# Patient Record
Sex: Female | Born: 1970 | Race: White | Hispanic: No | Smoking: Current every day smoker
Health system: Southern US, Community
[De-identification: ages and names within clinical notes are randomized; demographics above are authoritative.]

## PROBLEM LIST (undated history)

## (undated) DIAGNOSIS — M1990 Unspecified osteoarthritis, unspecified site: Secondary | ICD-10-CM

## (undated) DIAGNOSIS — F32A Depression, unspecified: Secondary | ICD-10-CM

## (undated) DIAGNOSIS — F419 Anxiety disorder, unspecified: Secondary | ICD-10-CM

## (undated) DIAGNOSIS — F329 Major depressive disorder, single episode, unspecified: Secondary | ICD-10-CM

## (undated) HISTORY — PX: TUBAL LIGATION: SHX77

---

## 2009-11-21 ENCOUNTER — Emergency Department (HOSPITAL_COMMUNITY): Admission: EM | Admit: 2009-11-21 | Discharge: 2009-11-21 | Payer: Self-pay | Admitting: Emergency Medicine

## 2010-03-22 ENCOUNTER — Emergency Department (HOSPITAL_COMMUNITY): Admission: EM | Admit: 2010-03-22 | Discharge: 2010-03-22 | Payer: Self-pay | Admitting: Emergency Medicine

## 2011-07-16 ENCOUNTER — Encounter (HOSPITAL_COMMUNITY): Payer: Self-pay

## 2011-07-16 ENCOUNTER — Emergency Department (HOSPITAL_COMMUNITY)
Admission: EM | Admit: 2011-07-16 | Discharge: 2011-07-16 | Disposition: A | Discharge status: Home / Self Care | Payer: No Typology Code available for payment source | Attending: Emergency Medicine | Admitting: Emergency Medicine

## 2011-07-16 ENCOUNTER — Emergency Department (HOSPITAL_COMMUNITY): Payer: No Typology Code available for payment source

## 2011-07-16 DIAGNOSIS — R079 Chest pain, unspecified: Secondary | ICD-10-CM | POA: Insufficient documentation

## 2011-07-16 DIAGNOSIS — M25519 Pain in unspecified shoulder: Secondary | ICD-10-CM | POA: Insufficient documentation

## 2011-07-16 DIAGNOSIS — M539 Dorsopathy, unspecified: Secondary | ICD-10-CM | POA: Insufficient documentation

## 2011-07-16 DIAGNOSIS — Z79899 Other long term (current) drug therapy: Secondary | ICD-10-CM | POA: Insufficient documentation

## 2011-07-16 DIAGNOSIS — M542 Cervicalgia: Secondary | ICD-10-CM | POA: Insufficient documentation

## 2011-07-16 DIAGNOSIS — S139XXA Sprain of joints and ligaments of unspecified parts of neck, initial encounter: Secondary | ICD-10-CM | POA: Insufficient documentation

## 2011-07-16 DIAGNOSIS — S161XXA Strain of muscle, fascia and tendon at neck level, initial encounter: Secondary | ICD-10-CM

## 2011-07-16 DIAGNOSIS — S40019A Contusion of unspecified shoulder, initial encounter: Secondary | ICD-10-CM | POA: Insufficient documentation

## 2011-07-16 DIAGNOSIS — S20219A Contusion of unspecified front wall of thorax, initial encounter: Secondary | ICD-10-CM | POA: Insufficient documentation

## 2011-07-16 MED ORDER — IBUPROFEN 600 MG PO TABS: 600.0000 mg | ORAL_TABLET | Freq: Four times a day (QID) | ORAL | Status: AC | PRN

## 2011-07-16 MED ORDER — OXYCODONE-ACETAMINOPHEN 5-325 MG PO TABS: 1.0000 | ORAL_TABLET | ORAL | Status: AC | PRN

## 2011-07-16 MED ORDER — OXYCODONE-ACETAMINOPHEN 5-325 MG PO TABS
1.0000 | ORAL_TABLET | Freq: Once | ORAL | Status: AC
  Administered 2011-07-16: 1 via ORAL
  Filled 2011-07-16: qty 1
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
## 2011-07-16 NOTE — ED Notes (Signed)
Pt c/o left shoulder pain and left side of neck pain.   Pt has bruising noted to chest.
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
## 2011-07-17 NOTE — ED Provider Notes (Signed)
Medical screening examination/treatment/procedure(s) were performed by non-physician practitioner and as supervising physician I was immediately available for consultation/collaboration.   Neya Creegan L Rodnesha Elie, MD 07/17/11 1920 

## 2011-12-18 ENCOUNTER — Other Ambulatory Visit: Payer: Self-pay

## 2011-12-18 ENCOUNTER — Encounter (HOSPITAL_COMMUNITY): Payer: Self-pay | Admitting: Emergency Medicine

## 2011-12-18 ENCOUNTER — Emergency Department (HOSPITAL_COMMUNITY): Payer: Self-pay

## 2011-12-18 ENCOUNTER — Emergency Department (HOSPITAL_COMMUNITY)
Admission: EM | Admit: 2011-12-18 | Discharge: 2011-12-18 | Disposition: A | Discharge status: Home / Self Care | Payer: Self-pay | Attending: Emergency Medicine | Admitting: Emergency Medicine

## 2011-12-18 DIAGNOSIS — R1110 Vomiting, unspecified: Secondary | ICD-10-CM | POA: Insufficient documentation

## 2011-12-18 DIAGNOSIS — M25519 Pain in unspecified shoulder: Secondary | ICD-10-CM | POA: Insufficient documentation

## 2011-12-18 DIAGNOSIS — R1013 Epigastric pain: Secondary | ICD-10-CM | POA: Insufficient documentation

## 2011-12-18 DIAGNOSIS — R10816 Epigastric abdominal tenderness: Secondary | ICD-10-CM | POA: Insufficient documentation

## 2011-12-18 HISTORY — DX: Anxiety disorder, unspecified: F41.9

## 2011-12-18 HISTORY — DX: Depression, unspecified: F32.A

## 2011-12-18 HISTORY — DX: Unspecified osteoarthritis, unspecified site: M19.90

## 2011-12-18 HISTORY — DX: Major depressive disorder, single episode, unspecified: F32.9

## 2011-12-18 LAB — CBC WITH DIFFERENTIAL/PLATELET
Basophils Relative: 0 % (ref 0–1)
Hemoglobin: 12.5 g/dL (ref 12.0–15.0)
MCHC: 33.9 g/dL (ref 30.0–36.0)
Monocytes Relative: 8 % (ref 3–12)
Neutro Abs: 3.3 10*3/uL (ref 1.7–7.7)
Neutrophils Relative %: 63 % (ref 43–77)
RBC: 3.98 MIL/uL (ref 3.87–5.11)
WBC: 5.3 10*3/uL (ref 4.0–10.5)

## 2011-12-18 LAB — COMPREHENSIVE METABOLIC PANEL
AST: 13 U/L (ref 0–37)
Albumin: 3.2 g/dL — ABNORMAL LOW (ref 3.5–5.2)
Alkaline Phosphatase: 50 U/L (ref 39–117)
BUN: 13 mg/dL (ref 6–23)
Chloride: 102 mEq/L (ref 96–112)
Potassium: 4 mEq/L (ref 3.5–5.1)
Total Bilirubin: 0.1 mg/dL — ABNORMAL LOW (ref 0.3–1.2)

## 2011-12-18 LAB — URINALYSIS, ROUTINE W REFLEX MICROSCOPIC
Bilirubin Urine: NEGATIVE
Glucose, UA: NEGATIVE mg/dL
Hgb urine dipstick: NEGATIVE
Ketones, ur: NEGATIVE mg/dL
pH: 7 (ref 5.0–8.0)

## 2011-12-18 LAB — LIPASE, BLOOD: Lipase: 57 U/L (ref 11–59)

## 2011-12-18 MED ORDER — GI COCKTAIL ~~LOC~~
30.0000 mL | Freq: Once | ORAL | Status: AC
  Administered 2011-12-18: 30 mL via ORAL
  Filled 2011-12-18: qty 30

## 2011-12-18 MED ORDER — ONDANSETRON 4 MG PO TBDP
4.0000 mg | ORAL_TABLET | Freq: Once | ORAL | Status: AC
  Administered 2011-12-18: 4 mg via ORAL
  Filled 2011-12-18: qty 1

## 2011-12-18 MED ORDER — OMEPRAZOLE 20 MG PO CPDR: 20.0000 mg | DELAYED_RELEASE_CAPSULE | Freq: Every day | ORAL | Status: DC

## 2011-12-18 MED ORDER — PANTOPRAZOLE SODIUM 40 MG PO TBEC
40.0000 mg | DELAYED_RELEASE_TABLET | Freq: Once | ORAL | Status: AC
  Administered 2011-12-18: 40 mg via ORAL
  Filled 2011-12-18: qty 1

## 2011-12-18 NOTE — ED Notes (Signed)
Pt c/o epigastric pain, abd swelling and vomiting since yesterday.
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
## 2012-01-23 ENCOUNTER — Ambulatory Visit (INDEPENDENT_AMBULATORY_CARE_PROVIDER_SITE_OTHER): Payer: Self-pay | Admitting: Gastroenterology

## 2012-01-23 ENCOUNTER — Encounter: Payer: Self-pay | Admitting: Gastroenterology

## 2012-01-23 VITALS — BP 102/71 | HR 79 | Temp 98.6°F | Ht 62.0 in | Wt 109.6 lb

## 2012-01-23 DIAGNOSIS — R1013 Epigastric pain: Secondary | ICD-10-CM

## 2012-01-23 DIAGNOSIS — Z139 Encounter for screening, unspecified: Secondary | ICD-10-CM

## 2012-01-23 MED ORDER — PANTOPRAZOLE SODIUM 40 MG PO TBEC: 40.0000 mg | DELAYED_RELEASE_TABLET | Freq: Every day | ORAL | Status: DC

## 2012-01-23 NOTE — Patient Instructions (Addendum)
Avoid medications such as ibuprofen, advil, aleve, motrin.  Start taking Protonix once daily, 30 minutes before the first meal of the day. This prescription has been sent to your pharmacy.   Seek medical attention right away if you notice any black, tarry stools, worsening belly pain, bloody vomit.   You have been set up for an upper endoscopy in the near future with Dr. Jena Gauss.  Further recommendations to follow once completed.

## 2012-01-23 NOTE — Progress Notes (Signed)
Referring Provider: Jeani [name] ED Primary Care Physician:  No primary provider on file. Primary Gastroenterologist:  Dr. Jena [name]   Chief Complaint  Patient presents with  . Abdominal Pain    HPI:  41 year old female who presents today at request of ED physician after being seen mid July for acute abdominal pain.  Notes new onset upper abdominal pain, sharp, located epigastric region. Notes abdominal tightness, bloating, swollen. Symptoms times one month currently. Notes weight loss since starting Celexa about 6 months ago. Used to weigh 136, now 109. Notes some days of decreased appetite. +early satiety, but she states has always been a light eater. No N/V. No fever, chills. No melena, hematochezia. Takes Ibuprofen for arthritis as needed but not daily. Denies reflux symptoms. Took Prilosec X 30 days, given at ED, no improvement. Gallbladder remains in situ. Notes hx of stomach ulcers as a teenager. Dr. Dimas [name] from [place] diagnosed her with ulcers. No prior EGD. Takes Maalox to alleviate abdominal pain.   Past Medical History  Diagnosis Date  . Arthritis   . Anxiety   . Depression     Past Surgical History  Procedure Date  . Tubal ligation     Current Outpatient Prescriptions  Medication Sig Dispense Refill  . acetaminophen (TYLENOL) 500 MG tablet Take 1,000 mg by mouth every 6 (six) hours as needed. For pain      . citalopram (CELEXA) 20 MG tablet Take 20 mg by mouth daily.       . naproxen sodium (ALEVE) 220 MG tablet Take 440 mg by mouth 2 (two) times daily as needed. Pain      . ALPRAZolam (XANAX) 1 MG tablet Take 1 mg by mouth 4 (four) times daily.       Marland Kitchen oxyCODONE-acetaminophen (PERCOCET) 5-325 MG per tablet Take 1 tablet by mouth every 4 (four) hours as needed. Pain      . pantoprazole (PROTONIX) 40 MG tablet Take 1 tablet (40 mg total) by mouth daily. Take 30 minutes before first meal of day.  30 tablet  1    Allergies as of 01/23/2012  . (No Known Allergies)    Family  History  Problem Relation Age of Onset  . Stomach cancer Mother 81    deceased age 61.   . Colon cancer Neg Hx     History   Social History  . Marital Status: Divorced    Spouse Name: Loretta [name]    Number of Children: 2  . Years of Education: Loretta [name]   Occupational History  .      works at [employer]   Social History Main Topics  . Smoking status: Current Everyday Smoker -- 0.2 packs/day    Types: Cigarettes  . Smokeless tobacco: Not on file  . Alcohol Use: No  . Drug Use: Yes    Special: Marijuana     smokes to try to increase appetite. Last used Aug 16th   . Sexually Active: Not on file   Other Topics Concern  . Not on file   Social History Narrative   Son, girlfriend, 3 kids live with her.     Review of Systems: Gen: SEE HPI CV: Denies chest pain, heart palpitations, syncope, peripheral edema. Resp: Denies shortness of breath with rest, cough, wheezing GI: Denies dysphagia or odynophagia. Denies hematemesis, fecal incontinence, or jaundice.  GU : Denies urinary burning, urinary frequency, urinary incontinence.  MS: Denies joint pain, muscle weakness, cramps, limited movement Derm: Denies rash, itching, dry skin Psych: Denies  depression, anxiety, confusion or memory loss  Heme: Denies bruising, bleeding, and enlarged lymph nodes.  Physical Exam: BP 102/71  Pulse 79  Temp 98.6 F (37 C) (Temporal)  Ht 5\' 2"  (1.575 m)  Wt 109 lb 9.6 oz (49.714 kg)  BMI 20.05 kg/m2  LMP 12/25/2011 General:   Alert and oriented. Well-developed, well-nourished, pleasant and cooperative. Head:  Normocephalic and atraumatic. Eyes:  Conjunctiva pink, sclera clear, no icterus.   Ears:  Normal auditory acuity. Nose:  No deformity, discharge,  or lesions. Mouth:  No deformity or lesions, mucosa pink and moist.  Neck:  Supple, without mass or thyromegaly. Lungs:  Clear to auscultation bilaterally, without wheezing, rales, or rhonchi.  Heart:  S1, S2 present without murmurs noted.    Abdomen:  +BS, soft, mild TTP epigastric region and non-distended. Without mass or HSM. No rebound or guarding. No hernias noted. Rectal:  Deferred  Msk:  Symmetrical without gross deformities. Normal posture. Extremities:  Without clubbing or edema. Neurologic:  Alert and  oriented x4;  grossly normal neurologically. Skin:  Intact, warm and dry without significant lesions or rashes Cervical Nodes:  No significant cervical adenopathy. Psych:  Alert and cooperative. Normal mood and affect.

## 2012-01-23 NOTE — Assessment & Plan Note (Signed)
41 year old female with new-onset dyspepsia in the setting of intermittent NSAID use. Notes remote hx of "ulcers" as a child, but she has never had an upper endoscopy. Mother diagnosed with stomach cancer in mid-40s, deceased shortly thereafter. Weight loss of 27 lbs concerning, although pt attributes this to starting Celexa 6 months ago. I am not entirely convinced this medication is the culprit. Concern for gastritis, PUD, occult malignancy unable to be excluded. Biliary etiology remains in differential although less likely.   Resume PPI: Will use Protonix due to risk of interaction with Prilosec and Celexa Obtain urine drug screen in about 3 weeks: pt uses marijuana occasionally Proceed with upper endoscopy in the near future with Dr. Jena Gauss. The risks, benefits, and alternatives have been discussed in detail with patient. They have stated understanding and desire to proceed.  Phenergan 25 mg IV on call due to hx of marijuana use

## 2012-01-23 NOTE — Progress Notes (Signed)
No PCP on file 

## 2012-01-24 NOTE — Progress Notes (Signed)
No PCP on file 

## 2012-01-30 ENCOUNTER — Encounter (HOSPITAL_COMMUNITY): Payer: Self-pay | Admitting: Pharmacy Technician

## 2012-02-09 ENCOUNTER — Telehealth: Payer: Self-pay | Admitting: Internal Medicine

## 2012-02-09 NOTE — Telephone Encounter (Signed)
Pt called with questions about her medicines (percocet) and other medicines she's taking. She is worried about her drug test today and upcoming procedure. Please call her back at [phone]

## 2012-02-09 NOTE — Telephone Encounter (Signed)
Talked with pt, answered all her questions.

## 2012-02-10 LAB — DRUG SCREEN, URINE
Amphetamine Screen, Ur: NEGATIVE
Barbiturate Quant, Ur: NEGATIVE
Cocaine Metabolites: NEGATIVE
Marijuana Metabolite: POSITIVE — AB
Opiates: NEGATIVE

## 2012-02-12 ENCOUNTER — Encounter (HOSPITAL_COMMUNITY): Payer: Self-pay | Admitting: *Deleted

## 2012-02-12 ENCOUNTER — Encounter (HOSPITAL_COMMUNITY)
Admission: RE | Disposition: A | Discharge status: Home / Self Care | Payer: Self-pay | Source: Ambulatory Visit | Attending: Internal Medicine

## 2012-02-12 ENCOUNTER — Ambulatory Visit: Admit: 2012-02-12 | Payer: Self-pay | Admitting: Internal Medicine

## 2012-02-12 ENCOUNTER — Ambulatory Visit (HOSPITAL_COMMUNITY)
Admission: RE | Admit: 2012-02-12 | Discharge: 2012-02-12 | Disposition: A | Discharge status: Home / Self Care | Payer: Self-pay | Source: Ambulatory Visit | Attending: Internal Medicine | Admitting: Internal Medicine

## 2012-02-12 DIAGNOSIS — K2960 Other gastritis without bleeding: Secondary | ICD-10-CM

## 2012-02-12 DIAGNOSIS — R1013 Epigastric pain: Secondary | ICD-10-CM

## 2012-02-12 DIAGNOSIS — R634 Abnormal weight loss: Secondary | ICD-10-CM | POA: Insufficient documentation

## 2012-02-12 DIAGNOSIS — Z139 Encounter for screening, unspecified: Secondary | ICD-10-CM

## 2012-02-12 HISTORY — PX: ESOPHAGOGASTRODUODENOSCOPY: SHX5428

## 2012-02-12 SURGERY — EGD (ESOPHAGOGASTRODUODENOSCOPY)
Anesthesia: Moderate Sedation

## 2012-02-12 MED ORDER — PROMETHAZINE HCL 25 MG/ML IJ SOLN
25.0000 mg | Freq: Once | INTRAMUSCULAR | Status: AC
  Administered 2012-02-12: 25 mg via INTRAVENOUS

## 2012-02-12 MED ORDER — MEPERIDINE HCL 100 MG/ML IJ SOLN
INTRAMUSCULAR | Status: AC
  Filled 2012-02-12: qty 2

## 2012-02-12 MED ORDER — PROMETHAZINE HCL 25 MG/ML IJ SOLN
INTRAMUSCULAR | Status: AC
  Filled 2012-02-12: qty 1

## 2012-02-12 MED ORDER — STERILE WATER FOR IRRIGATION IR SOLN
Status: DC | PRN
  Administered 2012-02-12: 14:00:00

## 2012-02-12 MED ORDER — SODIUM CHLORIDE 0.9 % IJ SOLN
INTRAMUSCULAR | Status: AC
  Filled 2012-02-12: qty 10

## 2012-02-12 MED ORDER — MEPERIDINE HCL 100 MG/ML IJ SOLN
INTRAMUSCULAR | Status: DC | PRN
  Administered 2012-02-12 (×3): 50 mg via INTRAVENOUS

## 2012-02-12 MED ORDER — MIDAZOLAM HCL 5 MG/5ML IJ SOLN
INTRAMUSCULAR | Status: AC
  Filled 2012-02-12: qty 10

## 2012-02-12 MED ORDER — MIDAZOLAM HCL 5 MG/5ML IJ SOLN
INTRAMUSCULAR | Status: DC | PRN
  Administered 2012-02-12 (×3): 2 mg via INTRAVENOUS

## 2012-02-12 MED ORDER — BUTAMBEN-TETRACAINE-BENZOCAINE 2-2-14 % EX AERO
INHALATION_SPRAY | CUTANEOUS | Status: DC | PRN
  Administered 2012-02-12: 2 via TOPICAL

## 2012-02-12 MED ORDER — SODIUM CHLORIDE 0.45 % IV SOLN
INTRAVENOUS | Status: DC
  Administered 2012-02-12: 1000 mL via INTRAVENOUS

## 2012-02-12 NOTE — Interval H&P Note (Signed)
History and Physical Interval Note:  02/12/2012 2:03 PM  Loretta [name]  has presented today for surgery, with the diagnosis of Epigastric Pain/Screening for unspecified purposes  The various methods of treatment have been discussed with the patient and family. After consideration of risks, benefits and other options for treatment, the patient has consented to  Procedure(s) (LRB) with comments: ESOPHAGOGASTRODUODENOSCOPY (EGD) (N/A) - 1:45/Give Phenergan 25mg  IV 30 mins prior to procedure as a surgical intervention .  The patient's history has been reviewed, patient examined, no change in status, stable for surgery.  I have reviewed the patient's chart and labs.  Questions were answered to the patient's satisfaction.     Eula Listen

## 2012-02-12 NOTE — Op Note (Signed)
[facility] [address] [place], [address]   ENDOSCOPY PROCEDURE REPORT  PATIENT: Loretta [name], Loretta [name].  MR#: [id number] BIRTHDATE: 05/19/1971 , 41  yrs. old GENDER: Female ENDOSCOPIST: Jonathon Bellows, MD FACP [facility] REFERRED BY: PROCEDURE DATE:  02/12/2012 PROCEDURE:     EGD with gastric biopsy  INDICATIONS:    Epigastric pain/dyspepsia/weight loss  INFORMED CONSENT:   The risks, benefits, limitations, alternatives and imponderables have been discussed.  The potential for biopsy, esophogeal dilation, etc. have also been reviewed.  Questions have been answered.  All parties agreeable.  Please see the history and physical in the medical record for more information.  MEDICATIONS:Versed 6 mg IV and Demerol 150 mg IV. Phenergan 25 mg IV. Cetacaine spray.  DESCRIPTION OF PROCEDURE:   The EG-[id number] ([id number])  endoscope was introduced through the mouth and advanced to the second portion of the duodenum without difficulty or limitations.  The mucosal surfaces were surveyed very carefully during advancement of the scope and upon withdrawal.  Retroflexion view of the proximal stomach and esophagogastric junction was performed.      FINDINGS: Normal esophagus. Stomach empty. Small hiatal hernia. Diffuse submucosal petechiae; multiple to 3 mm antral prepyloric erosions. No frank ulcer crater identified. No infiltrating process. Pylorus patent and easily traversed. Examination of the bulb second and third portion revealed no abnormalities  THERAPEUTIC / DIAGNOSTIC MANEUVERS PERFORMED:  biopsies the gastric body and antrum were taken for histologic study   COMPLICATIONS:  None  IMPRESSION:  Abnormal gastric mucosa with submucosal petechiae and antral erosions as described above-status post biopsy. Small hiatal hernia.  RECOMMENDATIONS:  Increase Protonix to 40 mg orally twice a day. Avoid all NSAIDs. Followup on pathology. Office visit with [place] in one  month.    _______________________________ R. Roetta Sessions, MD FACP [facility] eSigned:  R. Roetta Sessions, MD FACP [facility] 02/12/2012 2:42 PM     CC:  PATIENT NAME:  Loretta [name], Loretta [name]. MR#: [id number]

## 2012-02-12 NOTE — H&P (View-Only) (Signed)
Referring Provider: [facility] Primary Care Physician:  No primary provider on file. Primary Gastroenterologist:  Dr. Rourk   Chief Complaint  Patient presents with  . Abdominal Pain    HPI:  41-year-old female who presents today at request of ED physician after being seen mid July for acute abdominal pain.  Notes new onset upper abdominal pain, sharp, located epigastric region. Notes abdominal tightness, bloating, swollen. Symptoms times one month currently. Notes weight loss since starting Celexa about 6 months ago. Used to weigh 136, now 109. Notes some days of decreased appetite. +early satiety, but she states has always been a light eater. No N/V. No fever, chills. No melena, hematochezia. Takes Ibuprofen for arthritis as needed but not daily. Denies reflux symptoms. Took Prilosec X 30 days, given at ED, no improvement. Gallbladder remains in situ. Notes hx of stomach ulcers as a teenager. Dr. Howard from [facility] diagnosed her with ulcers. No prior EGD. Takes Maalox to alleviate abdominal pain.   Past Medical History  Diagnosis Date  . Arthritis   . Anxiety   . Depression     Past Surgical History  Procedure Date  . Tubal ligation     Current Outpatient Prescriptions  Medication Sig Dispense Refill  . acetaminophen (TYLENOL) 500 MG tablet Take 1,000 mg by mouth every 6 (six) hours as needed. For pain      . citalopram (CELEXA) 20 MG tablet Take 20 mg by mouth daily.       . naproxen sodium (ALEVE) 220 MG tablet Take 440 mg by mouth 2 (two) times daily as needed. Pain      . ALPRAZolam (XANAX) 1 MG tablet Take 1 mg by mouth 4 (four) times daily.       . oxyCODONE-acetaminophen (PERCOCET) 5-325 MG per tablet Take 1 tablet by mouth every 4 (four) hours as needed. Pain      . pantoprazole (PROTONIX) 40 MG tablet Take 1 tablet (40 mg total) by mouth daily. Take 30 minutes before first meal of day.  30 tablet  1    Allergies as of 01/23/2012  . (No Known Allergies)    Family  History  Problem Relation Age of Onset  . Stomach cancer Mother 45    deceased age 48.   . Colon cancer Neg Hx     History   Social History  . Marital Status: Divorced    Spouse Name: N/A    Number of Children: 2  . Years of Education: N/A   Occupational History  .      works at [employer]   Social History Main Topics  . Smoking status: Current Everyday Smoker -- 0.2 packs/day    Types: Cigarettes  . Smokeless tobacco: Not on file  . Alcohol Use: No  . Drug Use: Yes    Special: Marijuana     smokes to try to increase appetite. Last used Aug 16th   . Sexually Active: Not on file   Other Topics Concern  . Not on file   Social History Narrative   Son, girlfriend, 3 kids live with her.     Review of Systems: Gen: SEE HPI CV: Denies chest pain, heart palpitations, syncope, peripheral edema. Resp: Denies shortness of breath with rest, cough, wheezing GI: Denies dysphagia or odynophagia. Denies hematemesis, fecal incontinence, or jaundice.  GU : Denies urinary burning, urinary frequency, urinary incontinence.  MS: Denies joint pain, muscle weakness, cramps, limited movement Derm: Denies rash, itching, dry skin Psych: Denies   depression, anxiety, confusion or memory loss  Heme: Denies bruising, bleeding, and enlarged lymph nodes.  Physical Exam: BP 102/71  Pulse 79  Temp 98.6 F (37 C) (Temporal)  Ht 5' 2" (1.575 m)  Wt 109 lb 9.6 oz (49.714 kg)  BMI 20.05 kg/m2  LMP 12/25/2011 General:   Alert and oriented. Well-developed, well-nourished, pleasant and cooperative. Head:  Normocephalic and atraumatic. Eyes:  Conjunctiva pink, sclera clear, no icterus.   Ears:  Normal auditory acuity. Nose:  No deformity, discharge,  or lesions. Mouth:  No deformity or lesions, mucosa pink and moist.  Neck:  Supple, without mass or thyromegaly. Lungs:  Clear to auscultation bilaterally, without wheezing, rales, or rhonchi.  Heart:  S1, S2 present without murmurs noted.    Abdomen:  +BS, soft, mild TTP epigastric region and non-distended. Without mass or HSM. No rebound or guarding. No hernias noted. Rectal:  Deferred  Msk:  Symmetrical without gross deformities. Normal posture. Extremities:  Without clubbing or edema. Neurologic:  Alert and  oriented x4;  grossly normal neurologically. Skin:  Intact, warm and dry without significant lesions or rashes Cervical Nodes:  No significant cervical adenopathy. Psych:  Alert and cooperative. Normal mood and affect.   

## 2012-02-14 ENCOUNTER — Encounter (HOSPITAL_COMMUNITY): Payer: Self-pay | Admitting: Internal Medicine

## 2012-02-15 ENCOUNTER — Encounter: Payer: Self-pay | Admitting: *Deleted

## 2012-02-15 ENCOUNTER — Encounter: Payer: Self-pay | Admitting: Internal Medicine

## 2012-03-12 ENCOUNTER — Encounter: Payer: Self-pay | Admitting: Internal Medicine

## 2012-03-13 ENCOUNTER — Ambulatory Visit (INDEPENDENT_AMBULATORY_CARE_PROVIDER_SITE_OTHER): Payer: Self-pay | Admitting: Gastroenterology

## 2012-03-13 ENCOUNTER — Encounter: Payer: Self-pay | Admitting: Gastroenterology

## 2012-03-13 VITALS — BP 95/71 | HR 87 | Temp 98.2°F | Ht 62.0 in | Wt 110.0 lb

## 2012-03-13 DIAGNOSIS — K2970 Gastritis, unspecified, without bleeding: Secondary | ICD-10-CM

## 2012-03-13 MED ORDER — PANTOPRAZOLE SODIUM 40 MG PO TBEC: 40.0000 mg | DELAYED_RELEASE_TABLET | Freq: Two times a day (BID) | ORAL | Status: DC

## 2012-03-13 NOTE — Progress Notes (Signed)
Primary Care Physician: Sheila Oats, MD  Primary Gastroenterologist:  Roetta Sessions, MD   Chief Complaint  Patient presents with  . Follow-up    HPI: Loretta [name] is a 41 y.o. female here for f/u of recent EGD. Findings as outlined below. She presented in 01/2012 with c/o upper abd pain, bloating. Prilosec for 30 days did not help. Was on varying NSAIDs, anything from ibuprofen 800mg  daily plus Arthritis BC powders to Aleve. Denies any NSAID use since EGD. H/o weight loss in six months from 136 to 109 per patient, patient felt due to Celexa. Weight up one pound since OV here in 01/2012. H/O intermittent marijuana use.   Eating much better. Taking protonix BID. On amoxicillin for URI. Denies abdominal pain, melena, brbpr, constipation, diarrhea, heartburn, vomiting. She has significant arthritis and wants to know what she can take. States she has had increased pain since of NSAIDs.    Current Outpatient Prescriptions  Medication Sig Dispense Refill  . albuterol (PROVENTIL HFA;VENTOLIN HFA) 108 (90 BASE) MCG/ACT inhaler Inhale 2 puffs into the lungs every 6 (six) hours as needed.      . ALPRAZolam (XANAX) 1 MG tablet Take 1 mg by mouth 4 (four) times daily.       Marland Kitchen amoxicillin (AMOXIL) 500 MG tablet Take 500 mg by mouth 2 (two) times daily.      . citalopram (CELEXA) 20 MG tablet Take 20 mg by mouth daily.       . pantoprazole (PROTONIX) 40 MG tablet Take 1 tablet (40 mg total) by mouth 2 (two) times daily before a meal. Take 30 minutes before first meal of day.  60 tablet  3  . DISCONTD: pantoprazole (PROTONIX) 40 MG tablet Take 1 tablet (40 mg total) by mouth daily. Take 30 minutes before first meal of day.  30 tablet  1  . DISCONTD: pantoprazole (PROTONIX) 40 MG tablet Take 40 mg by mouth 2 (two) times daily before a meal. Take 30 minutes before first meal of day.        Allergies as of 03/13/2012  . (No Known Allergies)    ROS:  General: Negative for anorexia, weight loss,  fever, chills, fatigue, weakness. ENT: Negative for hoarseness, difficulty swallowing , nasal congestion. CV: Negative for chest pain, angina, palpitations, dyspnea on exertion, peripheral edema.  Respiratory: Negative for dyspnea at rest, dyspnea on exertion, cough, sputum, wheezing.  GI: See history of present illness. GU:  Negative for dysuria, hematuria, urinary incontinence, urinary frequency, nocturnal urination.  Endo: Negative for unusual weight change.    Physical Examination:   BP 95/71  Pulse 87  Temp 98.2 F (36.8 C) (Temporal)  Ht 5\' 2"  (1.575 m)  Wt 110 lb (49.896 kg)  BMI 20.12 kg/m2  LMP 02/14/2012  General: Well-nourished, well-developed in no acute distress.  Eyes: No icterus. Mouth: Oropharyngeal mucosa moist and pink , no lesions erythema or exudate. Lungs: Clear to auscultation bilaterally.  Heart: Regular rate and rhythm, no murmurs rubs or gallops.  Abdomen: Bowel sounds are normal, nontender, nondistended, no hepatosplenomegaly or masses, no abdominal bruits or hernia , no rebound or guarding.   Extremities: No lower extremity edema. No clubbing or deformities. Neuro: Alert and oriented x 4   Skin: Warm and dry, no jaundice.   Psych: Alert and cooperative, normal mood and affect.

## 2012-03-13 NOTE — Progress Notes (Signed)
No PCP on file 

## 2012-03-13 NOTE — Assessment & Plan Note (Signed)
Recent acute onset epigastric pain secondary to NSAID related antral erosions and inflammation. Negative for H.Pylori. Feels better on Protonix BID and NSAID avoidance. New prescription provided for protonix BID.  She should continue PPI BID until 05/05/12. Then drop back to once daily indefinitely. She complains of significant arthritic pain and states that she cannot function without anti-inflammatories. Discussed with patient today at length about possibility of recurrent gastritis or peptic ulcer disease related to NSAIDs. If she must use NSAIDs, I would advise that she chews one only in use at the lowest dose possible. She should stay on protonix indefinitely if she is going to require NSAID use. If she were to have recurrent abdominal pain, she should switch to an alternative instead of a different class to see if it's more tolerated. I have recommended that she avoid all BC powders. She should take one NSAID only and not use multiple at any given time as this would increase her risk of peptic ulcer disease. We will see her back in 6 months or she may call sooner if needed.

## 2012-03-13 NOTE — Patient Instructions (Addendum)
Continue Protonix (pantoprazole) twice a day until 05/05/12. Then you may decrease to once daily before breakfast.  Stay on Protonix as long as you need medication for your arthritis (ie, ibuprofen, Aleve, Naprosyn,etc). If you require going back on NSAIDs for your arthritis, you should pick only one agent. Do not mix up and use several at one time. Use small dose you can get by with. If you have recurrent abdominal pain, then you should avoid that particular medicine causing it.  Office visit in six months or call sooner if needed.  Gastritis, Adult Gastritis is soreness and swelling (inflammation) of the lining of the stomach. Gastritis can develop as a sudden onset (acute) or long-term (chronic) condition. If gastritis is not treated, it can lead to stomach bleeding and ulcers. CAUSES  Gastritis occurs when the stomach lining is weak or damaged. Digestive juices from the stomach then inflame the weakened stomach lining. The stomach lining may be weak or damaged due to viral or bacterial infections. One common bacterial infection is the Helicobacter pylori infection. Gastritis can also result from excessive alcohol consumption, taking certain medicines, or having too much acid in the stomach.  SYMPTOMS  In some cases, there are no symptoms. When symptoms are present, they may include:  Pain or a burning sensation in the upper abdomen.  Nausea.  Vomiting.  An uncomfortable feeling of fullness after eating. DIAGNOSIS  Your caregiver may suspect you have gastritis based on your symptoms and a physical exam. To determine the cause of your gastritis, your caregiver may perform the following:  Blood or stool tests to check for the H pylori bacterium.  Gastroscopy. A thin, flexible tube (endoscope) is passed down the esophagus and into the stomach. The endoscope has a light and camera on the end. Your caregiver uses the endoscope to view the inside of the stomach.  Taking a tissue sample (biopsy)  from the stomach to examine under a microscope. TREATMENT  Depending on the cause of your gastritis, medicines may be prescribed. If you have a bacterial infection, such as an H pylori infection, antibiotics may be given. If your gastritis is caused by too much acid in the stomach, H2 blockers or antacids may be given. Your caregiver may recommend that you stop taking aspirin, ibuprofen, or other nonsteroidal anti-inflammatory drugs (NSAIDs). HOME CARE INSTRUCTIONS  Only take over-the-counter or prescription medicines as directed by your caregiver.  If you were given antibiotic medicines, take them as directed. Finish them even if you start to feel better.  Drink enough fluids to keep your urine clear or pale yellow.  Avoid foods and drinks that make your symptoms worse, such as:  Caffeine or alcoholic drinks.  Chocolate.  Peppermint or mint flavorings.  Garlic and onions.  Spicy foods.  Citrus fruits, such as oranges, lemons, or limes.  Tomato-based foods such as sauce, chili, salsa, and pizza.  Fried and fatty foods.  Eat small, frequent meals instead of large meals. SEEK IMMEDIATE MEDICAL CARE IF:   You have black or dark red stools.  You vomit blood or material that looks like coffee grounds.  You are unable to keep fluids down.  Your abdominal pain gets worse.  You have a fever.  You do not feel better after 1 week.  You have any other questions or concerns. MAKE SURE YOU:  Understand these instructions.  Will watch your condition.  Will get help right away if you are not doing well or get worse. Document Released: 05/16/2001 Document  Revised: 11/21/2011 Document Reviewed: 07/05/2011 [facility] Patient Information 2013 [place], [place].

## 2012-09-12 ENCOUNTER — Encounter: Payer: Self-pay | Admitting: Internal Medicine

## 2013-01-01 ENCOUNTER — Encounter (HOSPITAL_COMMUNITY): Payer: Self-pay | Admitting: Emergency Medicine

## 2013-01-01 ENCOUNTER — Emergency Department (HOSPITAL_COMMUNITY)
Admission: EM | Admit: 2013-01-01 | Discharge: 2013-01-01 | Disposition: A | Discharge status: Home / Self Care | Payer: Self-pay | Attending: Emergency Medicine | Admitting: Emergency Medicine

## 2013-01-01 DIAGNOSIS — Z792 Long term (current) use of antibiotics: Secondary | ICD-10-CM | POA: Insufficient documentation

## 2013-01-01 DIAGNOSIS — F3289 Other specified depressive episodes: Secondary | ICD-10-CM | POA: Insufficient documentation

## 2013-01-01 DIAGNOSIS — Z8739 Personal history of other diseases of the musculoskeletal system and connective tissue: Secondary | ICD-10-CM | POA: Insufficient documentation

## 2013-01-01 DIAGNOSIS — K0889 Other specified disorders of teeth and supporting structures: Secondary | ICD-10-CM

## 2013-01-01 DIAGNOSIS — K089 Disorder of teeth and supporting structures, unspecified: Secondary | ICD-10-CM | POA: Insufficient documentation

## 2013-01-01 DIAGNOSIS — F411 Generalized anxiety disorder: Secondary | ICD-10-CM | POA: Insufficient documentation

## 2013-01-01 DIAGNOSIS — Z79899 Other long term (current) drug therapy: Secondary | ICD-10-CM | POA: Insufficient documentation

## 2013-01-01 DIAGNOSIS — F17200 Nicotine dependence, unspecified, uncomplicated: Secondary | ICD-10-CM | POA: Insufficient documentation

## 2013-01-01 DIAGNOSIS — F329 Major depressive disorder, single episode, unspecified: Secondary | ICD-10-CM | POA: Insufficient documentation

## 2013-01-01 MED ORDER — AMOXICILLIN 500 MG PO CAPS: 500.0000 mg | ORAL_CAPSULE | Freq: Three times a day (TID) | ORAL | Status: DC

## 2013-01-01 MED ORDER — TRAMADOL HCL 50 MG PO TABS: 50.0000 mg | ORAL_TABLET | Freq: Four times a day (QID) | ORAL | Status: DC | PRN

## 2013-01-01 NOTE — ED Notes (Signed)
Pt c/o left upper dental pain/swelling.

## 2013-01-01 NOTE — ED Provider Notes (Signed)
CSN: [id number]     Arrival date & time 01/01/13  4098 History     First MD Initiated Contact with Patient 01/01/13 0848     Chief Complaint  Patient presents with  . Dental Problem   (Consider location/radiation/quality/duration/timing/severity/associated sxs/prior Treatment) HPI Comments: Loretta Loretta [name] is a 42 y.o. female who presents to the Emergency Department complaining of tooth pain.  States that she has several left upper teeth that have cavities and continue to decay.  She states that one of her molars broke yesterday and she noticed increased pain and swelling of the gums this morning.  She also reports having tenderness to her left cheek.  She denies fever, difficulty swallowing or breathing, neck pain or vomiting.  She states she has contacted a dentist in [place] and she is trying to save money to have several extractions.     The history is provided by the patient.    Past Medical History  Diagnosis Date  . Arthritis   . Anxiety   . Depression    Past Surgical History  Procedure Laterality Date  . Tubal ligation    . Esophagogastroduodenoscopy  02/12/2012    Abnormal gastric mucosa with submucosal petechiae and antral erosions as described above-status post biopsy. Small hiatal hernia. Bx showed reactive changes with focal erosion and slight inflammation but no H.pylori.    Family History  Problem Relation Age of Onset  . Stomach cancer Mother 8    deceased age 40.   . Colon cancer Neg Hx    History  Substance Use Topics  . Smoking status: Current Every Day Smoker -- 0.25 packs/day    Types: Cigarettes  . Smokeless tobacco: Not on file  . Alcohol Use: No   OB History   Grav Para Term Preterm Abortions TAB SAB Ect Mult Living                 Review of Systems  Constitutional: Negative for fever and appetite change.  HENT: Positive for dental problem. Negative for congestion, sore throat, facial swelling, trouble swallowing, neck pain, neck stiffness and  voice change.   Eyes: Negative for pain and visual disturbance.  Respiratory: Negative for shortness of breath.   Neurological: Negative for dizziness, facial asymmetry and headaches.  Hematological: Negative for adenopathy.  All other systems reviewed and are negative.    Allergies  Review of patient's allergies indicates no known allergies.  Home Medications   Current Outpatient Rx  Loretta [name]  Route  Sig  Dispense  Refill  . albuterol (PROVENTIL HFA;VENTOLIN HFA) 108 (90 BASE) MCG/ACT inhaler   Inhalation   Inhale 2 puffs into the lungs every 6 (six) hours as needed.         . ALPRAZolam (XANAX) 1 MG tablet   Oral   Take 1 mg by mouth 4 (four) times daily.          Marland Kitchen amoxicillin (AMOXIL) 500 MG tablet   Oral   Take 500 mg by mouth 2 (two) times daily.         . pantoprazole (PROTONIX) 40 MG tablet   Oral   Take 1 tablet (40 mg total) by mouth 2 (two) times daily before a meal. Take 30 minutes before first meal of day.   60 tablet   3    BP 117/77  Pulse 76  Temp(Src) 97.7 F (36.5 C) (Oral)  SpO2 100%  LMP 01/01/2013 Physical Exam  Nursing note and vitals reviewed. Constitutional: She is  oriented to person, place, and time. She appears well-developed and well-nourished. No distress.  HENT:  Head: Normocephalic and atraumatic. No trismus in the jaw.  Right Ear: Tympanic membrane and ear canal normal.  Left Ear: Tympanic membrane and ear canal normal.  Mouth/Throat: Uvula is midline, oropharynx is clear and moist and mucous membranes are normal. Dental caries present. No dental abscesses or edematous.    ttp of the left upper premolars and molars.  Widespread dental decay is present.  No obvious dental abscess or fluctuance of the surrounding gums.  No trismus or facial edema.    Neck: Normal range of motion. Neck supple.  Cardiovascular: Normal rate, regular rhythm, normal heart sounds and intact distal pulses.   No murmur heard. Pulmonary/Chest: Effort normal  and breath sounds normal. No respiratory distress.  Musculoskeletal: Normal range of motion.  Lymphadenopathy:    She has no cervical adenopathy.  Neurological: She is alert and oriented to person, place, and time. She exhibits normal muscle tone. Coordination normal.  Skin: Skin is warm and dry.    ED Course   Procedures (including critical care time)  Labs Reviewed - No data to display No results found. No diagnosis found.  MDM    VSS.  Patient is non-toxic appearing.  Agrees to f/u with her dentist.  Appears stable for discharge.  No facial edema or trismus.  Appears stable for discharge.  Yessika Otte L. Corleone Biegler, PA-C 01/01/13 0920

## 2013-01-07 NOTE — ED Provider Notes (Signed)
Medical screening examination/treatment/procedure(s) were performed by non-physician practitioner and as supervising physician I was immediately available for consultation/collaboration.  Derwood Kaplan, MD 01/07/13 [phone]

## 2013-03-15 ENCOUNTER — Encounter (HOSPITAL_COMMUNITY): Payer: Self-pay | Admitting: Emergency Medicine

## 2013-03-15 ENCOUNTER — Emergency Department (HOSPITAL_COMMUNITY)
Admission: EM | Admit: 2013-03-15 | Discharge: 2013-03-15 | Disposition: A | Discharge status: Home / Self Care | Payer: Self-pay | Attending: Emergency Medicine | Admitting: Emergency Medicine

## 2013-03-15 DIAGNOSIS — Z79899 Other long term (current) drug therapy: Secondary | ICD-10-CM | POA: Insufficient documentation

## 2013-03-15 DIAGNOSIS — F411 Generalized anxiety disorder: Secondary | ICD-10-CM | POA: Insufficient documentation

## 2013-03-15 DIAGNOSIS — Z792 Long term (current) use of antibiotics: Secondary | ICD-10-CM | POA: Insufficient documentation

## 2013-03-15 DIAGNOSIS — R51 Headache: Secondary | ICD-10-CM | POA: Insufficient documentation

## 2013-03-15 DIAGNOSIS — Z8739 Personal history of other diseases of the musculoskeletal system and connective tissue: Secondary | ICD-10-CM | POA: Insufficient documentation

## 2013-03-15 DIAGNOSIS — K089 Disorder of teeth and supporting structures, unspecified: Secondary | ICD-10-CM | POA: Insufficient documentation

## 2013-03-15 DIAGNOSIS — F17200 Nicotine dependence, unspecified, uncomplicated: Secondary | ICD-10-CM | POA: Insufficient documentation

## 2013-03-15 DIAGNOSIS — F329 Major depressive disorder, single episode, unspecified: Secondary | ICD-10-CM | POA: Insufficient documentation

## 2013-03-15 DIAGNOSIS — K029 Dental caries, unspecified: Secondary | ICD-10-CM

## 2013-03-15 DIAGNOSIS — R110 Nausea: Secondary | ICD-10-CM | POA: Insufficient documentation

## 2013-03-15 DIAGNOSIS — F3289 Other specified depressive episodes: Secondary | ICD-10-CM | POA: Insufficient documentation

## 2013-03-15 MED ORDER — HYDROCODONE-ACETAMINOPHEN 5-325 MG PO TABS
1.0000 | ORAL_TABLET | Freq: Once | ORAL | Status: AC
  Administered 2013-03-15: 1 via ORAL
  Filled 2013-03-15: qty 1

## 2013-03-15 MED ORDER — CEPHALEXIN 500 MG PO CAPS: 500.0000 mg | ORAL_CAPSULE | Freq: Three times a day (TID) | ORAL | Status: DC

## 2013-03-15 MED ORDER — NAPROXEN 500 MG PO TABS: 500.0000 mg | ORAL_TABLET | Freq: Two times a day (BID) | ORAL | Status: DC

## 2013-03-15 MED ORDER — CEPHALEXIN 500 MG PO CAPS
500.0000 mg | ORAL_CAPSULE | Freq: Once | ORAL | Status: AC
  Administered 2013-03-15: 500 mg via ORAL
  Filled 2013-03-15: qty 1

## 2013-03-15 NOTE — ED Provider Notes (Signed)
CSN: [id number]     Arrival date & time 03/15/13  1311 History   First MD Initiated Contact with Patient 03/15/13 1334     Chief Complaint  Patient presents with  . Dental Pain   (Consider location/radiation/quality/duration/timing/severity/associated sxs/prior Treatment) Patient is a 42 y.o. female presenting with tooth pain. The history is provided by the patient.  Dental Pain Location:  Lower and upper Upper teeth location:  2/RU 2nd molar and 3/RU 1st molar Quality:  Throbbing Severity:  Severe Onset quality:  Gradual Duration:  2 days Timing:  Constant Progression:  Worsening Context: dental caries   Relieved by:  Nothing Worsened by:  Cold food/drink Ineffective treatments:  Acetaminophen Associated symptoms: facial pain   Associated symptoms: no fever and no headaches    LINDZEE GOUGE is a 42 y.o. female who presents to the ED with dental pain that is located in the upper right dental area. She was here about a month ago with pain in the left upper. She went to the dentist and he extracted the teeth on the left. She was started on antibiotics on her last visit her but they upset her stomach and the dentist changed it to Keflex and gave her hydrocodone. She plans to go back and get these teeth out as well.   Past Medical History  Diagnosis Date  . Arthritis   . Anxiety   . Depression    Past Surgical History  Procedure Laterality Date  . Tubal ligation    . Esophagogastroduodenoscopy  02/12/2012    Abnormal gastric mucosa with submucosal petechiae and antral erosions as described above-status post biopsy. Small hiatal hernia. Bx showed reactive changes with focal erosion and slight inflammation but no H.pylori.    Family History  Problem Relation Age of Onset  . Stomach cancer Mother 62    deceased age 14.   . Colon cancer Neg Hx    History  Substance Use Topics  . Smoking status: Current Every Day Smoker -- 0.25 packs/day    Types: Cigarettes  . Smokeless  tobacco: Not on file  . Alcohol Use: No   OB History   Grav Para Term Preterm Abortions TAB SAB Ect Mult Living   2 2 2             Review of Systems  Constitutional: Negative for fever and chills.  HENT: Positive for dental problem.   Eyes: Negative for visual disturbance.  Respiratory: Negative for shortness of breath.   Cardiovascular: Negative for chest pain.  Gastrointestinal: Positive for nausea. Negative for vomiting and abdominal pain.  Musculoskeletal: Negative for joint swelling.  Skin: Negative for wound.  Allergic/Immunologic: Negative for immunocompromised state.  Neurological: Negative for dizziness and headaches.  Psychiatric/Behavioral: The patient is not nervous/anxious.     Allergies  Review of patient's allergies indicates no known allergies.  Home Medications   Current Outpatient Rx  Name  Route  Sig  Dispense  Refill  . acetaminophen (TYLENOL) 500 MG tablet   Oral   Take 1,000 mg by mouth every 6 (six) hours as needed for pain.         Marland Kitchen albuterol (PROVENTIL HFA;VENTOLIN HFA) 108 (90 BASE) MCG/ACT inhaler   Inhalation   Inhale 2 puffs into the lungs every 6 (six) hours as needed.         . ALPRAZolam (XANAX) 1 MG tablet   Oral   Take 1 mg by mouth 4 (four) times daily.          Marland Kitchen  amoxicillin (AMOXIL) 500 MG capsule   Oral   Take 1 capsule (500 mg total) by mouth 3 (three) times daily.   30 capsule   0   . amoxicillin (AMOXIL) 500 MG tablet   Oral   Take 500 mg by mouth 2 (two) times daily.         . citalopram (CELEXA) 40 MG tablet   Oral   Take 40 mg by mouth daily.         Marland Kitchen EXPIRED: pantoprazole (PROTONIX) 40 MG tablet   Oral   Take 1 tablet (40 mg total) by mouth 2 (two) times daily before a meal. Take 30 minutes before first meal of day.   60 tablet   3   . traMADol (ULTRAM) 50 MG tablet   Oral   Take 1 tablet (50 mg total) by mouth every 6 (six) hours as needed for pain.   15 tablet   0    BP 103/56  Pulse 80   Temp(Src) 97.7 F (36.5 C) (Oral)  Resp 20  Ht 5\' 2"  (1.575 m)  Wt 110 lb (49.896 kg)  BMI 20.11 kg/m2  SpO2 100%  LMP 02/20/2013 Physical Exam  Nursing note and vitals reviewed. Constitutional: She is oriented to person, place, and time. She appears well-developed and well-nourished. No distress.  HENT:  Head: Normocephalic.  Mouth/Throat: Uvula is midline, oropharynx is clear and moist and mucous membranes are normal. Dental caries present.    Multiple caries, tender on exam.  Eyes: EOM are normal.  Neck: Neck supple.  Cardiovascular: Normal rate, regular rhythm and normal heart sounds.   Pulmonary/Chest: Effort normal and breath sounds normal.  Abdominal: Soft. Bowel sounds are normal. There is no tenderness.  Musculoskeletal: Normal range of motion.  Neurological: She is alert and oriented to person, place, and time. No cranial nerve deficit.  Skin: Skin is warm and dry.  Psychiatric: She has a normal mood and affect. Her behavior is normal.    ED Course  Procedures  MDM  42 y.o. female with dental pain due to caries. Will treat with antibiotics and Naprosyn.  Discussed with the patient and all questioned fully answered. She will follow up with the dentist as planned.    Medication List    TAKE these medications       cephALEXin 500 MG capsule  Commonly known as:  KEFLEX  Take 1 capsule (500 mg total) by mouth 3 (three) times daily.     naproxen 500 MG tablet  Commonly known as:  NAPROSYN  Take 1 tablet (500 mg total) by mouth 2 (two) times daily.      ASK your doctor about these medications       acetaminophen 500 MG tablet  Commonly known as:  TYLENOL  Take 1,000 mg by mouth every 6 (six) hours as needed for pain.     albuterol 108 (90 BASE) MCG/ACT inhaler  Commonly known as:  PROVENTIL HFA;VENTOLIN HFA  Inhale 2 puffs into the lungs every 6 (six) hours as needed.     ALPRAZolam 1 MG tablet  Commonly known as:  XANAX  Take 1 mg by mouth 4 (four)  times daily.     amoxicillin 500 MG capsule  Commonly known as:  AMOXIL  Take 1 capsule (500 mg total) by mouth 3 (three) times daily.     amoxicillin 500 MG tablet  Commonly known as:  AMOXIL  Take 500 mg by mouth 2 (two) times daily.  citalopram 40 MG tablet  Commonly known as:  CELEXA  Take 40 mg by mouth daily.     pantoprazole 40 MG tablet  Commonly known as:  PROTONIX  Take 1 tablet (40 mg total) by mouth 2 (two) times daily before a meal. Take 30 minutes before first meal of day.     traMADol 50 MG tablet  Commonly known as:  ULTRAM  Take 1 tablet (50 mg total) by mouth every 6 (six) hours as needed for pain.          Janne Napoleon, NP 03/15/13 1719

## 2013-03-15 NOTE — ED Notes (Signed)
Pt c/o R lower jaw dental pain, onset yesterday.

## 2013-03-16 NOTE — ED Provider Notes (Signed)
Medical screening examination/treatment/procedure(s) were performed by non-physician practitioner and as supervising physician I was immediately available for consultation/collaboration.   Dagmar Hait, MD 03/16/13 [phone]

## 2014-04-06 ENCOUNTER — Encounter (HOSPITAL_COMMUNITY): Payer: Self-pay | Admitting: Emergency Medicine

## 2015-11-25 ENCOUNTER — Encounter (HOSPITAL_COMMUNITY): Payer: Self-pay | Admitting: *Deleted

## 2015-11-25 ENCOUNTER — Emergency Department (HOSPITAL_COMMUNITY)
Admission: EM | Admit: 2015-11-25 | Discharge: 2015-11-25 | Disposition: A | Discharge status: Home / Self Care | Payer: Self-pay | Attending: Emergency Medicine | Admitting: Emergency Medicine

## 2015-11-25 DIAGNOSIS — F329 Major depressive disorder, single episode, unspecified: Secondary | ICD-10-CM | POA: Insufficient documentation

## 2015-11-25 DIAGNOSIS — F17210 Nicotine dependence, cigarettes, uncomplicated: Secondary | ICD-10-CM | POA: Insufficient documentation

## 2015-11-25 DIAGNOSIS — X101XXA Contact with hot food, initial encounter: Secondary | ICD-10-CM | POA: Insufficient documentation

## 2015-11-25 DIAGNOSIS — Y939 Activity, unspecified: Secondary | ICD-10-CM | POA: Insufficient documentation

## 2015-11-25 DIAGNOSIS — M1990 Unspecified osteoarthritis, unspecified site: Secondary | ICD-10-CM | POA: Insufficient documentation

## 2015-11-25 DIAGNOSIS — T23102A Burn of first degree of left hand, unspecified site, initial encounter: Secondary | ICD-10-CM | POA: Insufficient documentation

## 2015-11-25 DIAGNOSIS — Y929 Unspecified place or not applicable: Secondary | ICD-10-CM | POA: Insufficient documentation

## 2015-11-25 DIAGNOSIS — Y999 Unspecified external cause status: Secondary | ICD-10-CM | POA: Insufficient documentation

## 2015-11-25 MED ORDER — CEPHALEXIN 500 MG PO CAPS: 500.0000 mg | ORAL_CAPSULE | Freq: Three times a day (TID) | ORAL | Status: DC

## 2015-11-25 MED ORDER — SILVER SULFADIAZINE 1 % EX CREA
TOPICAL_CREAM | Freq: Once | CUTANEOUS | Status: AC
  Administered 2015-11-25: 16:00:00 via TOPICAL
  Filled 2015-11-25: qty 50

## 2015-11-25 MED ORDER — TETANUS-DIPHTH-ACELL PERTUSSIS 5-2.5-18.5 LF-MCG/0.5 IM SUSP
0.5000 mL | Freq: Once | INTRAMUSCULAR | Status: AC
  Administered 2015-11-25: 0.5 mL via INTRAMUSCULAR
  Filled 2015-11-25: qty 0.5

## 2015-11-25 MED ORDER — HYDROCODONE-ACETAMINOPHEN 5-325 MG PO TABS
1.0000 | ORAL_TABLET | Freq: Once | ORAL | Status: AC
  Administered 2015-11-25: 1 via ORAL
  Filled 2015-11-25: qty 1

## 2015-11-25 NOTE — ED Provider Notes (Signed)
CSN: [id number]     Arrival date & time 11/25/15  1511 History   First MD Initiated Contact with Patient 11/25/15 1529     Chief Complaint  Patient presents with  . Hand Burn     (Consider location/radiation/quality/duration/timing/severity/associated sxs/prior Treatment) HPI  Loretta [name] is a 45 y.o. female who presents to the Emergency Department complaining of burn to her left hand that occurred after accidentally splashing hot gravy on her hand.  Injury occurred three days ago.  She reports redness and mild pain to her hand that became worse yesterday after working.  She reports swelling to her hand and pain is increased with gripping.  She denies numbness, open wounds, wrist pain, or pain to her fingers.  She has been applying cool compresses today with minimal relief.      Past Medical History  Diagnosis Date  . Arthritis   . Anxiety   . Depression    Past Surgical History  Procedure Laterality Date  . Tubal ligation    . Esophagogastroduodenoscopy  02/12/2012    Abnormal gastric mucosa with submucosal petechiae and antral erosions as described above-status post biopsy. Small hiatal hernia. Bx showed reactive changes with focal erosion and slight inflammation but no H.pylori.    Family History  Problem Relation Age of Onset  . Stomach cancer Mother 2345    deceased age 46.   . Colon cancer Neg Hx    Social History  Substance Use Topics  . Smoking status: Current Every Day Smoker -- 0.25 packs/day    Types: Cigarettes  . Smokeless tobacco: None  . Alcohol Use: No   OB History    Gravida Para Term Preterm AB TAB SAB Ectopic Multiple Living   2 2 2             Review of Systems  Constitutional: Negative for fever and chills.  Musculoskeletal: Negative for joint swelling and arthralgias.  Skin: Positive for color change. Negative for wound.       Burn left hand  Neurological: Negative for weakness and numbness.  All other systems reviewed and are  negative.     Allergies  Amoxicillin  Home Medications   Prior to Admission medications   Medication Sig Start Date End Date Taking? Authorizing Provider  acetaminophen (TYLENOL) 500 MG tablet Take 1,000 mg by mouth every 6 (six) hours as needed for pain.    Historical Provider, MD  albuterol (PROVENTIL HFA;VENTOLIN HFA) 108 (90 BASE) MCG/ACT inhaler Inhale 2 puffs into the lungs every 6 (six) hours as needed.    Historical Provider, MD  ALPRAZolam Prudy Feeler(XANAX) 1 MG tablet Take 1 mg by mouth 4 (four) times daily.     Historical Provider, MD  amoxicillin (AMOXIL) 500 MG capsule Take 1 capsule (500 mg total) by mouth 3 (three) times daily. 01/01/13   Nataleah Scioneaux, PA-C  amoxicillin (AMOXIL) 500 MG tablet Take 500 mg by mouth 2 (two) times daily.    Historical Provider, MD  cephALEXin (KEFLEX) 500 MG capsule Take 1 capsule (500 mg total) by mouth 3 (three) times daily. 03/15/13   Hope Orlene OchM Neese, NP  citalopram (CELEXA) 40 MG tablet Take 40 mg by mouth daily.    Historical Provider, MD  naproxen (NAPROSYN) 500 MG tablet Take 1 tablet (500 mg total) by mouth 2 (two) times daily. 03/15/13   Hope Orlene OchM Neese, NP  pantoprazole (PROTONIX) 40 MG tablet Take 1 tablet (40 mg total) by mouth 2 (two) times daily before a meal.  Take 30 minutes before first meal of day. 03/13/12 03/13/13  Tiffany KocherLeslie S Lewis, PA-C  traMADol (ULTRAM) 50 MG tablet Take 1 tablet (50 mg total) by mouth every 6 (six) hours as needed for pain. 01/01/13   Tevan Marian, PA-C   BP 123/71 mmHg  Pulse 74  Temp(Src) 98.3 F (36.8 C) (Oral)  Resp 16  Ht 5\' 3"  (1.6 m)  Wt 52.164 kg  BMI 20.38 kg/m2  SpO2 100%  LMP 11/19/2015 Physical Exam  Constitutional: She is oriented to person, place, and time. She appears well-developed and well-nourished. No distress.  HENT:  Head: Normocephalic and atraumatic.  Neck: Normal range of motion. Neck supple.  Cardiovascular: Normal rate, regular rhythm and intact distal pulses.   Pulmonary/Chest: Effort  normal and breath sounds normal. No respiratory distress.  Musculoskeletal: She exhibits tenderness.  Erythema and mild edema of the thenar eminence of the left thumb.  Pt has full ROM of the fingers,  Sensation intact.  Neurological: She is alert and oriented to person, place, and time.  Skin: Skin is warm.  Psychiatric: She has a normal mood and affect.  Nursing note and vitals reviewed.   ED Course  Procedures (including critical care time) Labs Review Labs Reviewed - No data to display  Imaging Review No results found. I have personally reviewed and evaluated these images and lab results as part of my medical decision-making.   EKG Interpretation None      MDM   Final diagnoses:  Burn, hand, first degree, left, initial encounter   Pt well appearing, NV intact.  Return precautions given.  Burn appear superficial.   Medications  silver sulfADIAZINE (SILVADENE) 1 % cream ( Topical Given 11/25/15 1557)  Tdap (BOOSTRIX) injection 0.5 mL (0.5 mLs Intramuscular Given 11/25/15 1558)  HYDROcodone-acetaminophen (NORCO/VICODIN) 5-325 MG per tablet 1 tablet (1 tablet Oral Given 11/25/15 1557)        Genie Mirabal, PA-C 11/27/15 2131  Linwood DibblesJon Knapp, MD 11/29/15 1059

## 2015-11-25 NOTE — Discharge Instructions (Signed)
Burn Care °Burns hurt your skin. When your skin is hurt, it is easier to get an infection. Follow your doctor's directions to help prevent an infection. °HOME CARE °· Wash your hands well before you change your bandage. °· Change your bandage as often as told by your doctor. °¨ Remove the old bandage. If the bandage sticks, soak it off with cool, clean water. °¨ Gently clean the burn with mild soap and water. °¨ Pat the burn dry with a clean, dry cloth. °¨ Put a thin layer of medicated cream on the burn. °¨ Put a clean bandage on as told by your doctor. °¨ Keep the bandage clean and dry. °· Raise (elevate) the burn for the first 24 hours. After that, follow your doctor's directions. °· Only take medicine as told by your doctor. °GET HELP RIGHT AWAY IF:  °· You have too much pain. °· The skin near the burn is red, tender, puffy (swollen), or has red streaks. °· The burn area has yellowish white fluid (pus) or a bad smell coming from it. °· You have a fever. °MAKE SURE YOU:  °· Understand these instructions. °· Will watch your condition. °· Will get help right away if you are not doing well or get worse. °  °This information is not intended to replace advice given to you by your health care provider. Make sure you discuss any questions you have with your health care provider. °  °Document Released: 02/29/2008 Document Revised: 08/14/2011 Document Reviewed: 10/12/2010 °Elsevier Interactive Patient Education ©2016 [employer] ° °

## 2015-11-25 NOTE — ED Notes (Signed)
Tammy Triplett at bedside. 

## 2015-11-25 NOTE — ED Notes (Signed)
Pt had hot gravy splash on her left hand either Sunday or Monday per pt. Swelling noted to hand and per pt started yesterday

## 2019-01-30 ENCOUNTER — Other Ambulatory Visit (HOSPITAL_COMMUNITY): Payer: Self-pay | Admitting: *Deleted

## 2019-01-30 ENCOUNTER — Other Ambulatory Visit: Payer: Self-pay | Admitting: *Deleted

## 2019-01-30 DIAGNOSIS — M545 Low back pain, unspecified: Secondary | ICD-10-CM

## 2019-02-05 ENCOUNTER — Other Ambulatory Visit: Payer: Self-pay

## 2019-02-05 ENCOUNTER — Ambulatory Visit (HOSPITAL_COMMUNITY)
Admission: RE | Admit: 2019-02-05 | Discharge: 2019-02-05 | Disposition: A | Discharge status: Home / Self Care | Payer: Self-pay | Source: Ambulatory Visit | Attending: *Deleted | Admitting: *Deleted

## 2019-02-05 DIAGNOSIS — M545 Low back pain, unspecified: Secondary | ICD-10-CM

## 2019-02-05 DIAGNOSIS — M5440 Lumbago with sciatica, unspecified side: Secondary | ICD-10-CM | POA: Insufficient documentation

## 2020-03-22 IMAGING — MR MR LUMBAR SPINE W/O CM
4 of 5 series · 14 of 48 positions shown · non-contrast
Comparison: X-ray 01/09/2019

CLINICAL DATA: Low back pain for 1 year.

EXAM:
MRI LUMBAR SPINE WITHOUT CONTRAST
TECHNIQUE: Multiplanar, multisequence MR imaging of the lumbar spine was
performed. No intravenous contrast was administered.

[Series 3: T2 · sagittal · 4.0mm · 0.72mm/px · 5 of 15 slices shown (1 of 2)]
[im 1/15]
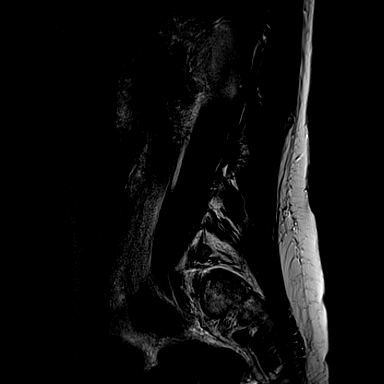
[im 4/15]
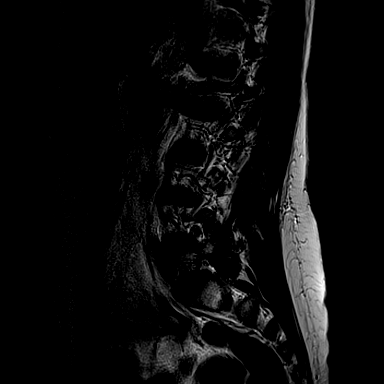
[im 8/15]
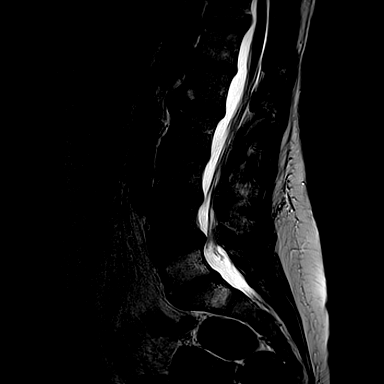
[im 11/15]
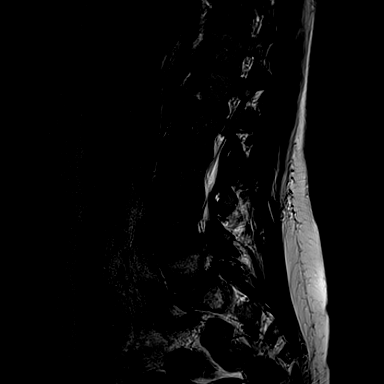
[im 15/15]
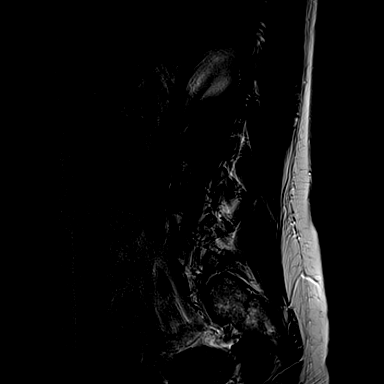

[Series 4: T1 · sagittal · 4.0mm · 0.36mm/px · 3 of 15 slices shown (1 of 2)]
[im 1/15]
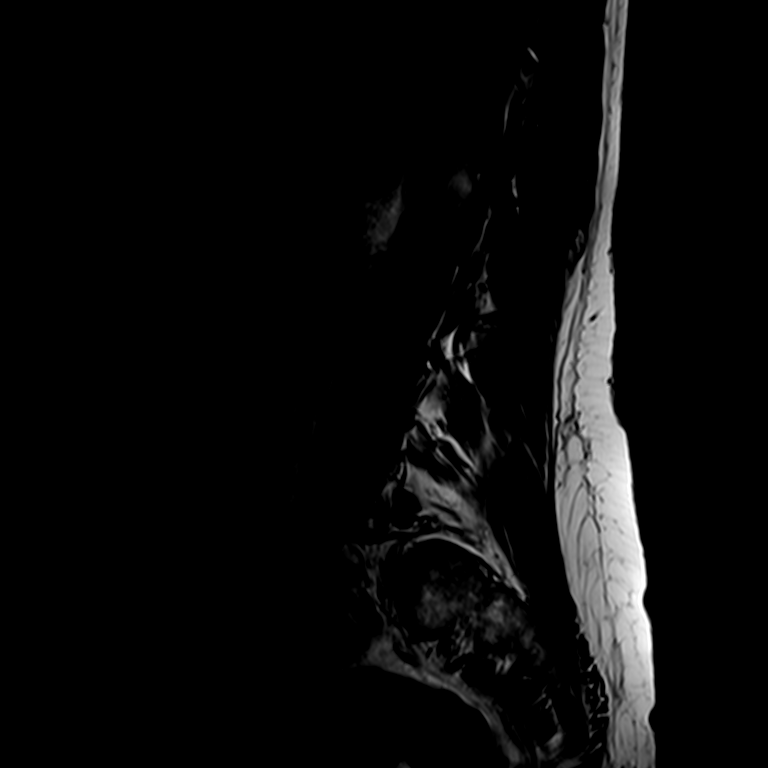
[im 8/15]
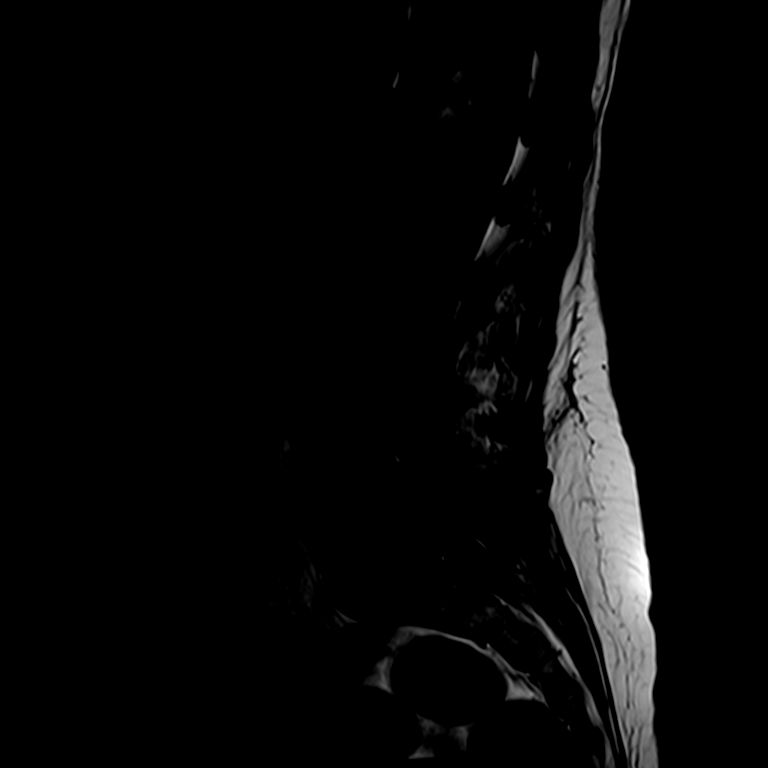
[im 15/15]
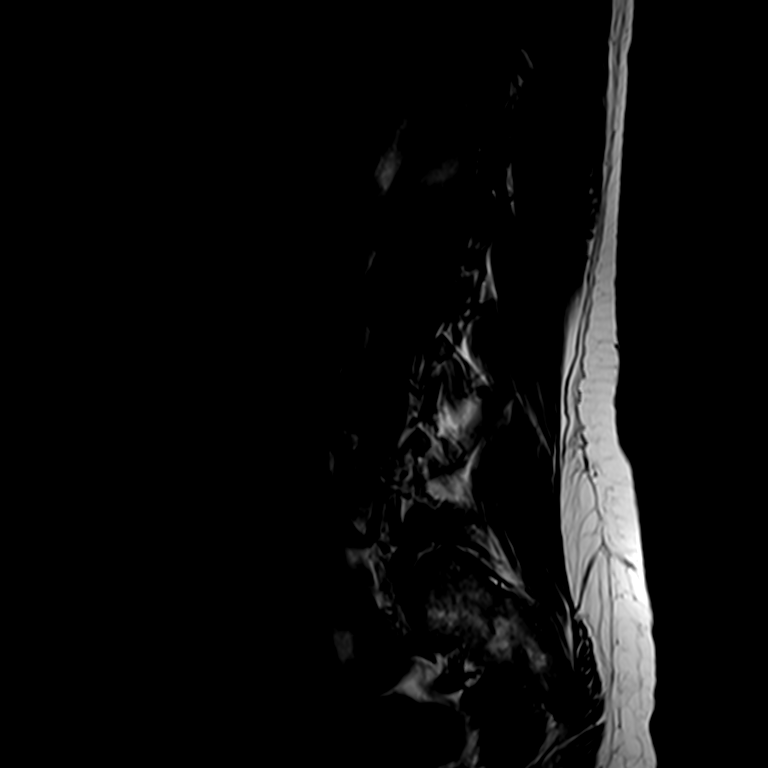

[Series 6: T2 · axial · 4.0mm · 0.27mm/px · z∈[-59,+92]mm · 3 of 42 slices shown (2 of 2)]
[im 6/42]
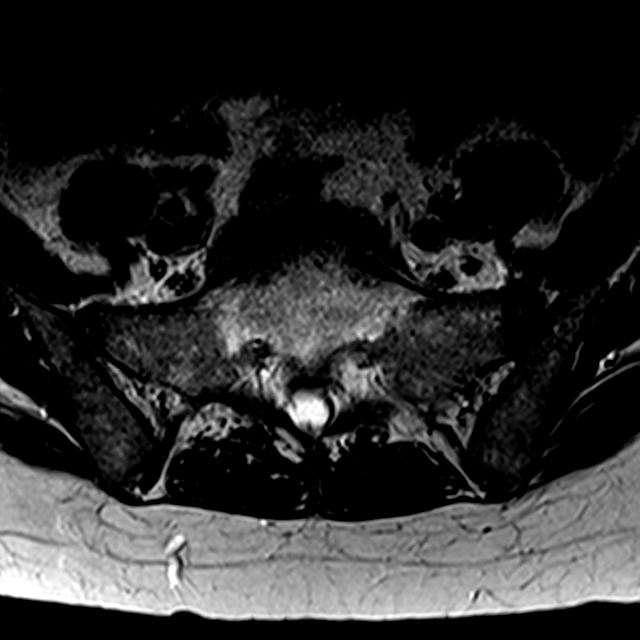
[im 22/42]
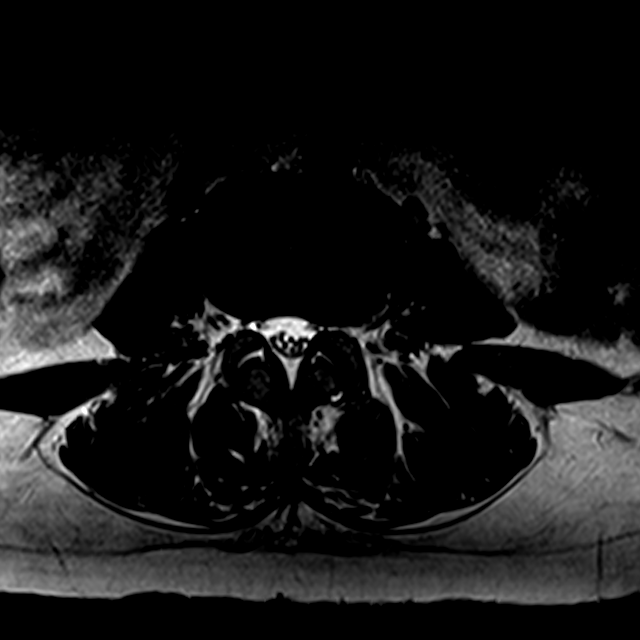
[im 36/42]
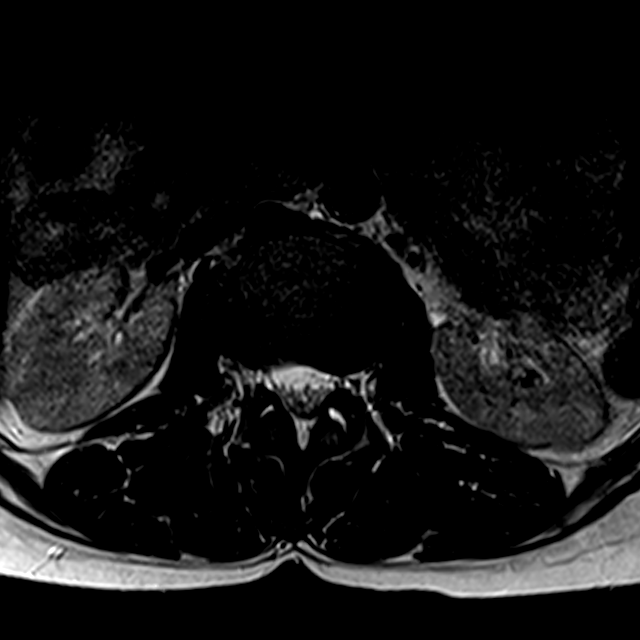

[Series 7: T1 · axial · 4.0mm · 0.27mm/px · z∈[-59,+92]mm · 3 of 42 slices shown (2 of 2)]
[im 6/42]
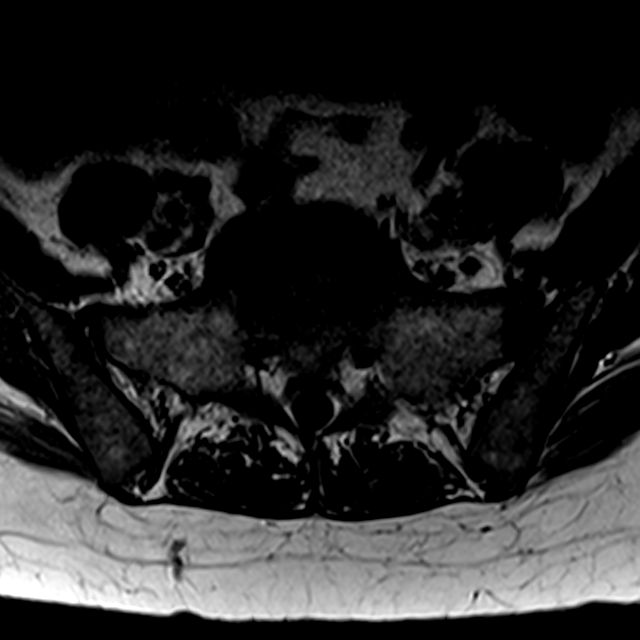
[im 22/42]
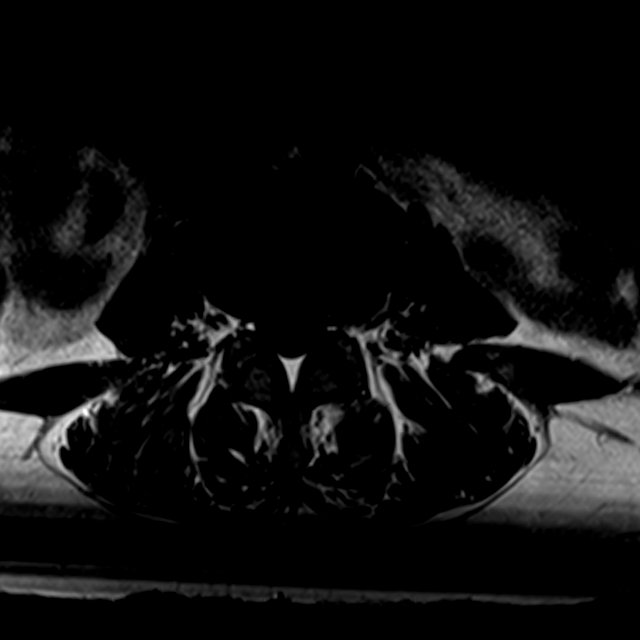
[im 36/42]
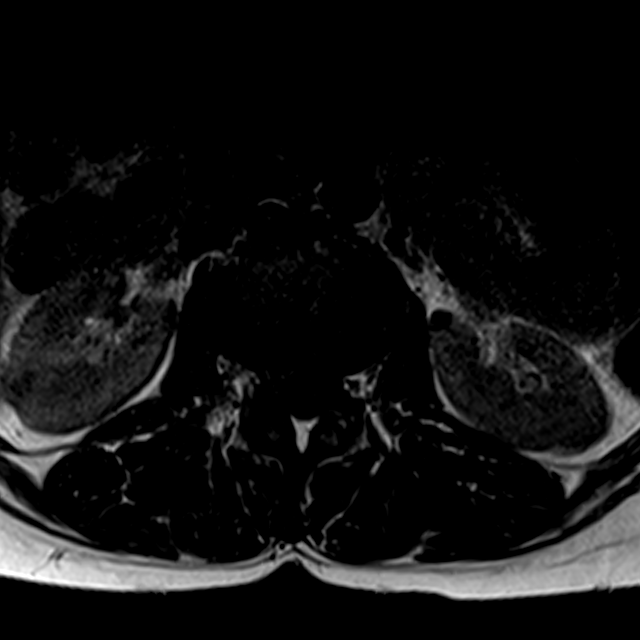

[14 of 48 positions shown; findings below may reference images not displayed]

FINDINGS: Segmentation: The inferior-most well developed disc space is
designated L5-S1.

Alignment:  Trace retrolisthesis L5 on S1.

Vertebrae: Extensive discogenic marrow changes centered at the L5-S1
level with endplate irregularity. Trace amount of increased T2
signal within the L5-S1 disc on the right, favored degenerative. No
surrounding soft tissue edema. Minimal anterior degenerative
endplate changes at L1-2. No fracture.

Conus medullaris and cauda equina: Conus extends to the L1 level.
Conus and cauda equina appear normal.

Paraspinal and other soft tissues: Negative.

Disc levels:

T12-L1: Sagittal sequences only. No significant disc protrusion,
foraminal stenosis, or canal stenosis.

L1-L2: Mild diffuse disc bulge.  No foraminal or canal stenosis.

L2-L3: Minimal diffuse disc bulge and early bilateral facet
arthrosis without foraminal or canal stenosis.

L3-L4: Diffuse disc bulge with left foraminal/far lateral component
and mild bilateral facet arthropathy resulting in mild left
foraminal stenosis. No canal stenosis.

L4-L5: Posterior disc protrusion, slightly eccentric to the left
with moderate bilateral facet arthrosis and ligamentum flavum
buckling resulting in mild bilateral foraminal stenosis and
mild-to-moderate canal stenosis.

L5-S1: Central disc protrusion and moderate bilateral facet
arthrosis resulting in mild right greater than left foraminal
stenosis and mild narrowing of the right subarticular recess. No
canal stenosis.
IMPRESSION: 1. Multilevel lumbar spondylosis, most advanced at the L4-5 level
where there is mild bilateral foraminal stenosis and
mild-to-moderate canal stenosis.
2. Advanced degenerative disc disease with extensive reactive marrow
changes at the L5-S1 level. Findings are favored to represent acute
(Modic type 1) degenerative type endplate changes. Changes related
to osteomyelitis-discitis are felt to be less likely given lack of
surrounding inflammatory changes. Correlation with serum
inflammatory markers is suggestive if clinical concern for
infection.

These results will be called to the ordering clinician or
representative by the Radiologist Assistant, and communication
documented in the PACS or zVision Dashboard.

## 2022-07-20 DIAGNOSIS — Z79899 Other long term (current) drug therapy: Secondary | ICD-10-CM | POA: Diagnosis not present

## 2022-07-20 DIAGNOSIS — K219 Gastro-esophageal reflux disease without esophagitis: Secondary | ICD-10-CM | POA: Diagnosis not present

## 2022-07-20 DIAGNOSIS — Z6827 Body mass index (BMI) 27.0-27.9, adult: Secondary | ICD-10-CM | POA: Diagnosis not present

## 2022-07-20 DIAGNOSIS — E7849 Other hyperlipidemia: Secondary | ICD-10-CM | POA: Diagnosis not present

## 2022-07-20 DIAGNOSIS — M5459 Other low back pain: Secondary | ICD-10-CM | POA: Diagnosis not present

## 2022-07-20 DIAGNOSIS — G602 Neuropathy in association with hereditary ataxia: Secondary | ICD-10-CM | POA: Diagnosis not present

## 2022-07-20 DIAGNOSIS — J301 Allergic rhinitis due to pollen: Secondary | ICD-10-CM | POA: Diagnosis not present

## 2022-09-08 ENCOUNTER — Other Ambulatory Visit (HOSPITAL_COMMUNITY): Payer: Self-pay | Admitting: Neurosurgery

## 2022-09-08 DIAGNOSIS — M48062 Spinal stenosis, lumbar region with neurogenic claudication: Secondary | ICD-10-CM

## 2022-10-18 DIAGNOSIS — M25511 Pain in right shoulder: Secondary | ICD-10-CM | POA: Diagnosis not present

## 2022-10-18 DIAGNOSIS — M25551 Pain in right hip: Secondary | ICD-10-CM | POA: Diagnosis not present

## 2022-11-14 ENCOUNTER — Other Ambulatory Visit (HOSPITAL_COMMUNITY): Payer: Self-pay | Admitting: Neurosurgery

## 2022-11-14 DIAGNOSIS — M48062 Spinal stenosis, lumbar region with neurogenic claudication: Secondary | ICD-10-CM

## 2022-12-08 ENCOUNTER — Ambulatory Visit (HOSPITAL_COMMUNITY)
Admission: RE | Admit: 2022-12-08 | Discharge: 2022-12-08 | Disposition: A | Discharge status: Home / Self Care | Payer: Medicaid Other | Source: Ambulatory Visit | Attending: Neurosurgery | Admitting: Neurosurgery

## 2022-12-08 DIAGNOSIS — M48062 Spinal stenosis, lumbar region with neurogenic claudication: Secondary | ICD-10-CM | POA: Insufficient documentation

## 2022-12-28 DIAGNOSIS — Z6828 Body mass index (BMI) 28.0-28.9, adult: Secondary | ICD-10-CM | POA: Diagnosis not present

## 2022-12-28 DIAGNOSIS — M5126 Other intervertebral disc displacement, lumbar region: Secondary | ICD-10-CM | POA: Diagnosis not present

## 2023-01-04 DIAGNOSIS — M48062 Spinal stenosis, lumbar region with neurogenic claudication: Secondary | ICD-10-CM | POA: Diagnosis not present

## 2023-01-22 DIAGNOSIS — Z6829 Body mass index (BMI) 29.0-29.9, adult: Secondary | ICD-10-CM | POA: Diagnosis not present

## 2023-01-22 DIAGNOSIS — M48062 Spinal stenosis, lumbar region with neurogenic claudication: Secondary | ICD-10-CM | POA: Diagnosis not present

## 2023-01-22 DIAGNOSIS — M5126 Other intervertebral disc displacement, lumbar region: Secondary | ICD-10-CM | POA: Diagnosis not present

## 2023-02-07 DIAGNOSIS — Z124 Encounter for screening for malignant neoplasm of cervix: Secondary | ICD-10-CM | POA: Diagnosis not present

## 2023-02-07 DIAGNOSIS — N390 Urinary tract infection, site not specified: Secondary | ICD-10-CM | POA: Diagnosis not present

## 2023-02-07 DIAGNOSIS — Z1151 Encounter for screening for human papillomavirus (HPV): Secondary | ICD-10-CM | POA: Diagnosis not present

## 2023-02-07 DIAGNOSIS — N898 Other specified noninflammatory disorders of vagina: Secondary | ICD-10-CM | POA: Diagnosis not present

## 2023-02-07 DIAGNOSIS — N939 Abnormal uterine and vaginal bleeding, unspecified: Secondary | ICD-10-CM | POA: Diagnosis not present

## 2023-02-07 DIAGNOSIS — Z01419 Encounter for gynecological examination (general) (routine) without abnormal findings: Secondary | ICD-10-CM | POA: Diagnosis not present

## 2023-02-07 DIAGNOSIS — Z87898 Personal history of other specified conditions: Secondary | ICD-10-CM | POA: Diagnosis not present

## 2023-03-12 DIAGNOSIS — H5213 Myopia, bilateral: Secondary | ICD-10-CM | POA: Diagnosis not present

## 2023-03-26 DIAGNOSIS — M5126 Other intervertebral disc displacement, lumbar region: Secondary | ICD-10-CM | POA: Diagnosis not present

## 2023-03-26 DIAGNOSIS — M48062 Spinal stenosis, lumbar region with neurogenic claudication: Secondary | ICD-10-CM | POA: Diagnosis not present

## 2023-04-16 DIAGNOSIS — M48062 Spinal stenosis, lumbar region with neurogenic claudication: Secondary | ICD-10-CM | POA: Diagnosis not present

## 2023-05-08 DIAGNOSIS — R300 Dysuria: Secondary | ICD-10-CM | POA: Diagnosis not present

## 2023-05-08 DIAGNOSIS — N939 Abnormal uterine and vaginal bleeding, unspecified: Secondary | ICD-10-CM | POA: Diagnosis not present

## 2023-05-08 DIAGNOSIS — N898 Other specified noninflammatory disorders of vagina: Secondary | ICD-10-CM | POA: Diagnosis not present

## 2023-05-08 DIAGNOSIS — R8289 Other abnormal findings on cytological and histological examination of urine: Secondary | ICD-10-CM | POA: Diagnosis not present

## 2023-05-08 DIAGNOSIS — N390 Urinary tract infection, site not specified: Secondary | ICD-10-CM | POA: Diagnosis not present

## 2023-05-10 DIAGNOSIS — Z6828 Body mass index (BMI) 28.0-28.9, adult: Secondary | ICD-10-CM | POA: Diagnosis not present

## 2023-05-10 DIAGNOSIS — M5126 Other intervertebral disc displacement, lumbar region: Secondary | ICD-10-CM | POA: Diagnosis not present

## 2023-06-15 DIAGNOSIS — N950 Postmenopausal bleeding: Secondary | ICD-10-CM | POA: Diagnosis not present

## 2023-06-15 DIAGNOSIS — Z9889 Other specified postprocedural states: Secondary | ICD-10-CM | POA: Diagnosis not present

## 2023-06-22 ENCOUNTER — Other Ambulatory Visit: Payer: Self-pay | Admitting: Neurosurgery

## 2023-06-25 NOTE — Pre-Procedure Instructions (Signed)
Surgical Instructions   Your procedure is scheduled on June 29, 2023. Report to [facility] Main Entrance "A" at 11:15 A.M., then check in with the Admitting office. Any questions or running late day of surgery: call [phone]  Questions prior to your surgery date: call [phone], Monday-Friday, 8am-4pm. If you experience any cold or flu symptoms such as cough, fever, chills, shortness of breath, etc. between now and your scheduled surgery, please notify us at the above number.     Remember:  Do not eat after midnight the night before your surgery  You may drink clear liquids until 10:15AM the morning of your surgery.   Clear liquids allowed are: Water, Non-Citrus Juices (without pulp), Carbonated Beverages, Clear Tea (no milk, honey, etc.), Black Coffee Only (NO MILK, CREAM OR POWDERED CREAMER of any kind), and Gatorade.    Take these medicines the morning of surgery with A SIP OF WATER: ALPRAZolam (XANAX)  DULoxetine (CYMBALTA)  pantoprazole (PROTONIX)  fluticasone (FLONASE) 50 MCG/ACT nasal spray  gabapentin (NEURONTIN)  omeprazole (PRILOSEC)   May take these medicines IF NEEDED: acetaminophen (TYLENOL)  albuterol (PROVENTIL HFA;VENTOLIN HFA) 108 (90 BASE) MCG/ACT inhaler - Please bring all inhalers with you the day of surgery.    One week prior to surgery, STOP taking any Aspirin (unless otherwise instructed by your surgeon) Aleve, Naproxen, Ibuprofen, Motrin, Advil, Goody's, BC's, all herbal medications, fish oil, and non-prescription vitamins, and CELEBREX                      Do NOT Smoke (Tobacco/Vaping) for 24 hours prior to your procedure.  If you use a CPAP at night, you may bring your mask/headgear for your overnight stay.   You will be asked to remove any contacts, glasses, piercing's, hearing aid's, dentures/partials prior to surgery. Please bring cases for these items if needed.    Patients discharged the day of surgery will not be allowed to drive home,  and someone needs to stay with them for 24 hours.  SURGICAL WAITING ROOM VISITATION Patients may have no more than 2 support people in the waiting area - these visitors may rotate.   Pre-op nurse will coordinate an appropriate time for 1 ADULT support person, who may not rotate, to accompany patient in pre-op.  Children under the age of 68 must have an adult with them who is not the patient and must remain in the main waiting area with an adult.  If the patient needs to stay at the hospital during part of their recovery, the visitor guidelines for inpatient rooms apply.  Please refer to the [facility] website for the visitor guidelines for any additional information.   If you received a COVID test during your pre-op visit  it is requested that you wear a mask when out in public, stay away from anyone that may not be feeling well and notify your surgeon if you develop symptoms. If you have been in contact with anyone that has tested positive in the last 10 days please notify you surgeon.      Pre-operative 5 CHG Bathing Instructions   You can play a key role in reducing the risk of infection after surgery. Your skin needs to be as free of germs as possible. You can reduce the number of germs on your skin by washing with CHG (chlorhexidine gluconate) soap before surgery. CHG is an antiseptic soap that kills germs and continues to kill germs even after washing.   DO NOT use  if you have an allergy to chlorhexidine/CHG or antibacterial soaps. If your skin becomes reddened or irritated, stop using the CHG and notify one of our RNs at [phone].   Please shower with the CHG soap starting 4 days before surgery using the following schedule:     Please keep in mind the following:  DO NOT shave, including legs and underarms, starting the day of your first shower.   You may shave your face at any point before/day of surgery.  Place clean sheets on your bed the day you start using CHG soap. Use  a clean washcloth (not used since being washed) for each shower. DO NOT sleep with pets once you start using the CHG.   CHG Shower Instructions:  Wash your face and private area with normal soap. If you choose to wash your hair, wash first with your normal shampoo.  After you use shampoo/soap, rinse your hair and body thoroughly to remove shampoo/soap residue.  Turn the water OFF and apply about 3 tablespoons (45 ml) of CHG soap to a CLEAN washcloth.  Apply CHG soap ONLY FROM YOUR NECK DOWN TO YOUR TOES (washing for 3-5 minutes)  DO NOT use CHG soap on face, private areas, open wounds, or sores.  Pay special attention to the area where your surgery is being performed.  If you are having back surgery, having someone wash your back for you may be helpful. Wait 2 minutes after CHG soap is applied, then you may rinse off the CHG soap.  Pat dry with a clean towel  Put on clean clothes/pajamas   If you choose to wear lotion, please use ONLY the CHG-compatible lotions that are listed below.  Additional instructions for the day of surgery: DO NOT APPLY any lotions, deodorants, cologne, or perfumes.   Do not bring valuables to the hospital. [facility] is not responsible for any belongings/valuables. Do not wear nail polish, gel polish, artificial nails, or any other type of covering on natural nails (fingers and toes) Do not wear jewelry or makeup Put on clean/comfortable clothes.  Please brush your teeth.  Ask your nurse before applying any prescription medications to the skin.     CHG Compatible Lotions   Aveeno Moisturizing lotion  Cetaphil Moisturizing Cream  Cetaphil Moisturizing Lotion  Clairol Herbal Essence Moisturizing Lotion, Dry Skin  Clairol Herbal Essence Moisturizing Lotion, Extra Dry Skin  Clairol Herbal Essence Moisturizing Lotion, Normal Skin  Curel Age Defying Therapeutic Moisturizing Lotion with Alpha Hydroxy  Curel Extreme Care Body Lotion  Curel Soothing Hands  Moisturizing Hand Lotion  Curel Therapeutic Moisturizing Cream, Fragrance-Free  Curel Therapeutic Moisturizing Lotion, Fragrance-Free  Curel Therapeutic Moisturizing Lotion, Original Formula  Eucerin Daily Replenishing Lotion  Eucerin Dry Skin Therapy Plus Alpha Hydroxy Crme  Eucerin Dry Skin Therapy Plus Alpha Hydroxy Lotion  Eucerin Original Crme  Eucerin Original Lotion  Eucerin Plus Crme Eucerin Plus Lotion  Eucerin TriLipid Replenishing Lotion  Keri Anti-Bacterial Hand Lotion  Keri Deep Conditioning Original Lotion Dry Skin Formula Softly Scented  Keri Deep Conditioning Original Lotion, Fragrance Free Sensitive Skin Formula  Keri Lotion Fast Absorbing Fragrance Free Sensitive Skin Formula  Keri Lotion Fast Absorbing Softly Scented Dry Skin Formula  Keri Original Lotion  Keri Skin Renewal Lotion Keri Silky Smooth Lotion  Keri Silky Smooth Sensitive Skin Lotion  Nivea Body Creamy Conditioning Oil  Nivea Body Extra Enriched Teacher, adult education Moisturizing Lotion Nivea Crme  Nivea Skin Firming Lotion  NutraDerm 30 Skin Lotion  NutraDerm Skin Lotion  NutraDerm Therapeutic Skin Cream  NutraDerm Therapeutic Skin Lotion  ProShield Protective Hand Cream  Provon moisturizing lotion  Please read over the following fact sheets that you were given.

## 2023-06-26 ENCOUNTER — Other Ambulatory Visit: Payer: Self-pay | Admitting: Neurosurgery

## 2023-06-26 ENCOUNTER — Inpatient Hospital Stay (HOSPITAL_COMMUNITY)
Admission: RE | Admit: 2023-06-26 | Discharge: 2023-06-26 | Disposition: A | Discharge status: Home / Self Care | Payer: Medicaid Other | Source: Ambulatory Visit

## 2023-06-27 ENCOUNTER — Encounter (HOSPITAL_COMMUNITY)
Admission: RE | Admit: 2023-06-27 | Discharge: 2023-06-27 | Disposition: A | Discharge status: Home / Self Care | Payer: Medicaid Other | Source: Ambulatory Visit | Attending: Neurosurgery | Admitting: Neurosurgery

## 2023-06-27 ENCOUNTER — Encounter (HOSPITAL_COMMUNITY): Payer: Self-pay | Admitting: General Practice

## 2023-06-27 ENCOUNTER — Other Ambulatory Visit: Payer: Self-pay

## 2023-06-27 VITALS — BP 113/95 | HR 89 | Temp 97.7°F | Resp 17 | Ht 62.0 in | Wt 149.0 lb

## 2023-06-27 DIAGNOSIS — Z01812 Encounter for preprocedural laboratory examination: Secondary | ICD-10-CM | POA: Insufficient documentation

## 2023-06-27 DIAGNOSIS — Z01818 Encounter for other preprocedural examination: Secondary | ICD-10-CM

## 2023-06-27 LAB — SURGICAL PCR SCREEN
MRSA, PCR: NEGATIVE
Staphylococcus aureus: NEGATIVE

## 2023-06-27 LAB — CBC
HCT: 44.7 % (ref 36.0–46.0)
Hemoglobin: 15.1 g/dL — ABNORMAL HIGH (ref 12.0–15.0)
MCH: 31.5 pg (ref 26.0–34.0)
MCHC: 33.8 g/dL (ref 30.0–36.0)
MCV: 93.3 fL (ref 80.0–100.0)
Platelets: 340 10*3/uL (ref 150–400)
RBC: 4.79 MIL/uL (ref 3.87–5.11)
RDW: 13.5 % (ref 11.5–15.5)
WBC: 7.9 10*3/uL (ref 4.0–10.5)
nRBC: 0 % (ref 0.0–0.2)

## 2023-06-27 NOTE — Progress Notes (Signed)
PCP - [facility] Cardiologist - denies  PPM/ICD - denies Device Orders - n/a Rep Notified - n/a  Chest x-ray - denies EKG - denies Stress Test - denies ECHO - denies Cardiac Cath - denies  Sleep Study - denies CPAP - n/a  No DM  Last dose of GLP1 agonist-  n/a GLP1 instructions: n/a  Blood Thinner Instructions: n/a Aspirin Instructions: n/a  ERAS Protcol - clears until 1015  COVID TEST- n/a   Anesthesia review: no  Patient denies shortness of breath, fever, cough and chest pain at PAT appointment   All instructions explained to the patient, with a verbal understanding of the material. Patient agrees to go over the instructions while at home for a better understanding. Patient also instructed to self quarantine after being tested for COVID-19. The opportunity to ask questions was provided.

## 2023-06-27 NOTE — Progress Notes (Signed)
Surgical Instructions   Your procedure is scheduled on Friday, January 24th, 2025. Report to [facility] Main Entrance "A" at 11:15 A.M., then check in with the Admitting office. Any questions or running late day of surgery: call [phone]  Questions prior to your surgery date: call [phone], Monday-Friday, 8am-4pm. If you experience any cold or flu symptoms such as cough, fever, chills, shortness of breath, etc. between now and your scheduled surgery, please notify us at the above number.     Remember:  Do not eat after midnight the night before your surgery  You may drink clear liquids until 10:15 the morning of your surgery.   Clear liquids allowed are: Water, Non-Citrus Juices (without pulp), Carbonated Beverages, Clear Tea (no milk, honey, etc.), Black Coffee Only (NO MILK, CREAM OR POWDERED CREAMER of any kind), and Gatorade.    Take these medicines the morning of surgery with A SIP OF WATER: Alprazolam (Xanax) Duloxetine (Cymbalta) Fluticasone (Flonase) Gabapentin (Neurontin) Omeprazole (Prilosec)   May take these medicines IF NEEDED: Acetaminophen (Tylenol) Albuterol Inhaler (please bring with you on the day of surgery)   One week prior to surgery, STOP taking any Aspirin (unless otherwise instructed by your surgeon) Aleve, Naproxen, Ibuprofen, Motrin, Advil, Goody's, BC's, all herbal medications, fish oil, and non-prescription vitamins.  This also includes Celebrex.                     Do NOT Smoke (Tobacco/Vaping) for 24 hours prior to your procedure.  If you use a CPAP at night, you may bring your mask/headgear for your overnight stay.   You will be asked to remove any contacts, glasses, piercing's, hearing aid's, dentures/partials prior to surgery. Please bring cases for these items if needed.    Patients discharged the day of surgery will not be allowed to drive home, and someone needs to stay with them for 24 hours.  SURGICAL WAITING ROOM  VISITATION Patients may have no more than 2 support people in the waiting area - these visitors may rotate.   Pre-op nurse will coordinate an appropriate time for 1 ADULT support person, who may not rotate, to accompany patient in pre-op.  Children under the age of 58 must have an adult with them who is not the patient and must remain in the main waiting area with an adult.  If the patient needs to stay at the hospital during part of their recovery, the visitor guidelines for inpatient rooms apply.  Please refer to the [facility] website for the visitor guidelines for any additional information.   If you received a COVID test during your pre-op visit  it is requested that you wear a mask when out in public, stay away from anyone that may not be feeling well and notify your surgeon if you develop symptoms. If you have been in contact with anyone that has tested positive in the last 10 days please notify you surgeon.      Pre-operative 5 CHG Bathing Instructions   You can play a key role in reducing the risk of infection after surgery. Your skin needs to be as free of germs as possible. You can reduce the number of germs on your skin by washing with CHG (chlorhexidine gluconate) soap before surgery. CHG is an antiseptic soap that kills germs and continues to kill germs even after washing.   DO NOT use if you have an allergy to chlorhexidine/CHG or antibacterial soaps. If your skin becomes reddened or irritated, stop using  the CHG and notify one of our RNs at [phone].   Please shower with the CHG soap starting 4 days before surgery using the following schedule:     Please keep in mind the following:  DO NOT shave, including legs and underarms, starting the day of your first shower.   You may shave your face at any point before/day of surgery.  Place clean sheets on your bed the day you start using CHG soap. Use a clean washcloth (not used since being washed) for each shower. DO NOT  sleep with pets once you start using the CHG.   CHG Shower Instructions:  Wash your face and private area with normal soap. If you choose to wash your hair, wash first with your normal shampoo.  After you use shampoo/soap, rinse your hair and body thoroughly to remove shampoo/soap residue.  Turn the water OFF and apply about 3 tablespoons (45 ml) of CHG soap to a CLEAN washcloth.  Apply CHG soap ONLY FROM YOUR NECK DOWN TO YOUR TOES (washing for 3-5 minutes)  DO NOT use CHG soap on face, private areas, open wounds, or sores.  Pay special attention to the area where your surgery is being performed.  If you are having back surgery, having someone wash your back for you may be helpful. Wait 2 minutes after CHG soap is applied, then you may rinse off the CHG soap.  Pat dry with a clean towel  Put on clean clothes/pajamas   If you choose to wear lotion, please use ONLY the CHG-compatible lotions that are listed below.  Additional instructions for the day of surgery: DO NOT APPLY any lotions, deodorants, cologne, or perfumes.   Do not bring valuables to the hospital. [facility] is not responsible for any belongings/valuables. Do not wear nail polish, gel polish, artificial nails, or any other type of covering on natural nails (fingers and toes) Do not wear jewelry or makeup Put on clean/comfortable clothes.  Please brush your teeth.  Ask your nurse before applying any prescription medications to the skin.     CHG Compatible Lotions   Aveeno Moisturizing lotion  Cetaphil Moisturizing Cream  Cetaphil Moisturizing Lotion  Clairol Herbal Essence Moisturizing Lotion, Dry Skin  Clairol Herbal Essence Moisturizing Lotion, Extra Dry Skin  Clairol Herbal Essence Moisturizing Lotion, Normal Skin  Curel Age Defying Therapeutic Moisturizing Lotion with Alpha Hydroxy  Curel Extreme Care Body Lotion  Curel Soothing Hands Moisturizing Hand Lotion  Curel Therapeutic Moisturizing Cream,  Fragrance-Free  Curel Therapeutic Moisturizing Lotion, Fragrance-Free  Curel Therapeutic Moisturizing Lotion, Original Formula  Eucerin Daily Replenishing Lotion  Eucerin Dry Skin Therapy Plus Alpha Hydroxy Crme  Eucerin Dry Skin Therapy Plus Alpha Hydroxy Lotion  Eucerin Original Crme  Eucerin Original Lotion  Eucerin Plus Crme Eucerin Plus Lotion  Eucerin TriLipid Replenishing Lotion  Keri Anti-Bacterial Hand Lotion  Keri Deep Conditioning Original Lotion Dry Skin Formula Softly Scented  Keri Deep Conditioning Original Lotion, Fragrance Free Sensitive Skin Formula  Keri Lotion Fast Absorbing Fragrance Free Sensitive Skin Formula  Keri Lotion Fast Absorbing Softly Scented Dry Skin Formula  Keri Original Lotion  Keri Skin Renewal Lotion Keri Silky Smooth Lotion  Keri Silky Smooth Sensitive Skin Lotion  Nivea Body Creamy Conditioning Oil  Nivea Body Extra Enriched Teacher, adult education Moisturizing Lotion Nivea Crme  Nivea Skin Firming Lotion  NutraDerm 30 Skin Lotion  NutraDerm Skin Lotion  NutraDerm Therapeutic Skin Cream  NutraDerm Therapeutic Skin Lotion  ProShield Protective Hand Cream  Provon moisturizing lotion  Please read over the following fact sheets that you were given.

## 2023-06-29 ENCOUNTER — Encounter (HOSPITAL_COMMUNITY): Payer: Self-pay | Admitting: Neurosurgery

## 2023-06-29 ENCOUNTER — Ambulatory Visit (HOSPITAL_COMMUNITY): Payer: Medicaid Other

## 2023-06-29 ENCOUNTER — Ambulatory Visit (HOSPITAL_COMMUNITY)
Admission: RE | Admit: 2023-06-29 | Discharge: 2023-06-29 | Disposition: A | Discharge status: Home / Self Care | Payer: Medicaid Other | Attending: Neurosurgery | Admitting: Neurosurgery

## 2023-06-29 ENCOUNTER — Other Ambulatory Visit: Payer: Self-pay

## 2023-06-29 ENCOUNTER — Encounter (HOSPITAL_COMMUNITY)
Admission: RE | Disposition: A | Discharge status: Home / Self Care | Payer: Self-pay | Source: Home / Self Care | Attending: Neurosurgery

## 2023-06-29 ENCOUNTER — Ambulatory Visit (HOSPITAL_BASED_OUTPATIENT_CLINIC_OR_DEPARTMENT_OTHER): Payer: Medicaid Other | Admitting: Registered Nurse

## 2023-06-29 ENCOUNTER — Ambulatory Visit (HOSPITAL_COMMUNITY): Payer: Medicaid Other | Admitting: Registered Nurse

## 2023-06-29 DIAGNOSIS — F418 Other specified anxiety disorders: Secondary | ICD-10-CM | POA: Diagnosis not present

## 2023-06-29 DIAGNOSIS — F17200 Nicotine dependence, unspecified, uncomplicated: Secondary | ICD-10-CM | POA: Diagnosis not present

## 2023-06-29 DIAGNOSIS — M1990 Unspecified osteoarthritis, unspecified site: Secondary | ICD-10-CM | POA: Insufficient documentation

## 2023-06-29 DIAGNOSIS — M5126 Other intervertebral disc displacement, lumbar region: Secondary | ICD-10-CM

## 2023-06-29 HISTORY — PX: LUMBAR LAMINECTOMY/DECOMPRESSION MICRODISCECTOMY: SHX5026

## 2023-06-29 SURGERY — LUMBAR LAMINECTOMY/DECOMPRESSION MICRODISCECTOMY 1 LEVEL
Anesthesia: General | Site: Spine Lumbar | Laterality: Left

## 2023-06-29 MED ORDER — FENTANYL CITRATE (PF) 100 MCG/2ML IJ SOLN
INTRAMUSCULAR | Status: DC | PRN
  Administered 2023-06-29: 100 ug via INTRAVENOUS

## 2023-06-29 MED ORDER — CHLORHEXIDINE GLUCONATE 0.12 % MT SOLN
OROMUCOSAL | Status: AC
  Administered 2023-06-29: 15 mL via OROMUCOSAL
  Filled 2023-06-29: qty 15

## 2023-06-29 MED ORDER — ONDANSETRON HCL 4 MG/2ML IJ SOLN
INTRAMUSCULAR | Status: DC | PRN
  Administered 2023-06-29: 4 mg via INTRAVENOUS

## 2023-06-29 MED ORDER — MIDAZOLAM HCL 2 MG/2ML IJ SOLN
INTRAMUSCULAR | Status: AC
  Filled 2023-06-29: qty 2

## 2023-06-29 MED ORDER — OXYCODONE HCL 5 MG PO TABS
ORAL_TABLET | ORAL | Status: AC
  Filled 2023-06-29: qty 1

## 2023-06-29 MED ORDER — AMISULPRIDE (ANTIEMETIC) 5 MG/2ML IV SOLN: 10.0000 mg | Freq: Once | INTRAVENOUS | Status: DC | PRN

## 2023-06-29 MED ORDER — ROCURONIUM BROMIDE 10 MG/ML (PF) SYRINGE
PREFILLED_SYRINGE | INTRAVENOUS | Status: AC
  Filled 2023-06-29: qty 10

## 2023-06-29 MED ORDER — CHLORHEXIDINE GLUCONATE 0.12 % MT SOLN: 15.0000 mL | Freq: Once | OROMUCOSAL | Status: AC

## 2023-06-29 MED ORDER — FENTANYL CITRATE (PF) 250 MCG/5ML IJ SOLN
INTRAMUSCULAR | Status: AC
  Filled 2023-06-29: qty 5

## 2023-06-29 MED ORDER — OXYCODONE HCL 5 MG PO TABS: 5.0000 mg | ORAL_TABLET | Freq: Four times a day (QID) | ORAL | 0 refills | Status: AC | PRN

## 2023-06-29 MED ORDER — PHENYLEPHRINE HCL-NACL 20-0.9 MG/250ML-% IV SOLN: INTRAVENOUS | Status: DC | PRN

## 2023-06-29 MED ORDER — KETAMINE HCL 50 MG/5ML IJ SOSY
PREFILLED_SYRINGE | INTRAMUSCULAR | Status: AC
  Filled 2023-06-29: qty 5

## 2023-06-29 MED ORDER — PROPOFOL 10 MG/ML IV BOLUS
INTRAVENOUS | Status: AC
  Filled 2023-06-29: qty 20

## 2023-06-29 MED ORDER — ACETAMINOPHEN 500 MG PO TABS: 1000.0000 mg | ORAL_TABLET | Freq: Once | ORAL | Status: DC

## 2023-06-29 MED ORDER — ORAL CARE MOUTH RINSE: 15.0000 mL | Freq: Once | OROMUCOSAL | Status: AC

## 2023-06-29 MED ORDER — LIDOCAINE 2% (20 MG/ML) 5 ML SYRINGE
INTRAMUSCULAR | Status: DC | PRN
  Administered 2023-06-29: 60 mg via INTRAVENOUS

## 2023-06-29 MED ORDER — LIDOCAINE-EPINEPHRINE 0.5 %-1:200000 IJ SOLN
INTRAMUSCULAR | Status: AC
  Filled 2023-06-29: qty 50

## 2023-06-29 MED ORDER — DEXAMETHASONE SODIUM PHOSPHATE 10 MG/ML IJ SOLN
INTRAMUSCULAR | Status: AC
  Filled 2023-06-29: qty 1

## 2023-06-29 MED ORDER — ONDANSETRON HCL 4 MG/2ML IJ SOLN
INTRAMUSCULAR | Status: AC
  Filled 2023-06-29: qty 2

## 2023-06-29 MED ORDER — HYDROMORPHONE HCL 1 MG/ML IJ SOLN
INTRAMUSCULAR | Status: AC
  Filled 2023-06-29: qty 0.5

## 2023-06-29 MED ORDER — METHYLPREDNISOLONE ACETATE 80 MG/ML IJ SUSP
INTRAMUSCULAR | Status: AC
  Filled 2023-06-29: qty 1

## 2023-06-29 MED ORDER — CEFAZOLIN SODIUM-DEXTROSE 2-3 GM-%(50ML) IV SOLR
INTRAVENOUS | Status: DC | PRN
  Administered 2023-06-29: 2 g via INTRAVENOUS

## 2023-06-29 MED ORDER — OXYCODONE HCL 5 MG PO TABS
5.0000 mg | ORAL_TABLET | Freq: Once | ORAL | Status: AC | PRN
Start: 2023-06-29 — End: 2023-06-29
  Administered 2023-06-29: 5 mg via ORAL

## 2023-06-29 MED ORDER — FENTANYL CITRATE (PF) 100 MCG/2ML IJ SOLN
INTRAMUSCULAR | Status: AC
  Filled 2023-06-29: qty 2

## 2023-06-29 MED ORDER — FENTANYL CITRATE (PF) 250 MCG/5ML IJ SOLN
INTRAMUSCULAR | Status: DC | PRN
  Administered 2023-06-29 (×3): 50 ug via INTRAVENOUS

## 2023-06-29 MED ORDER — HYDROMORPHONE HCL 1 MG/ML IJ SOLN
INTRAMUSCULAR | Status: AC
  Filled 2023-06-29: qty 1

## 2023-06-29 MED ORDER — THROMBIN 20000 UNITS EX SOLR
CUTANEOUS | Status: DC | PRN
  Administered 2023-06-29: 20 mL via TOPICAL

## 2023-06-29 MED ORDER — METHYLPREDNISOLONE ACETATE 80 MG/ML IJ SUSP
INTRAMUSCULAR | Status: DC | PRN
  Administered 2023-06-29: 80 mg

## 2023-06-29 MED ORDER — PHENYLEPHRINE 80 MCG/ML (10ML) SYRINGE FOR IV PUSH (FOR BLOOD PRESSURE SUPPORT)
PREFILLED_SYRINGE | INTRAVENOUS | Status: DC | PRN
  Administered 2023-06-29 (×4): 80 ug via INTRAVENOUS

## 2023-06-29 MED ORDER — THROMBIN 20000 UNITS EX SOLR
CUTANEOUS | Status: AC
  Filled 2023-06-29: qty 20000

## 2023-06-29 MED ORDER — DEXAMETHASONE SODIUM PHOSPHATE 10 MG/ML IJ SOLN
INTRAMUSCULAR | Status: DC | PRN
  Administered 2023-06-29: 10 mg via INTRAVENOUS

## 2023-06-29 MED ORDER — LACTATED RINGERS IV SOLN: INTRAVENOUS | Status: DC

## 2023-06-29 MED ORDER — KETOROLAC TROMETHAMINE 30 MG/ML IJ SOLN
INTRAMUSCULAR | Status: AC
  Filled 2023-06-29: qty 1

## 2023-06-29 MED ORDER — KETAMINE HCL 10 MG/ML IJ SOLN
INTRAMUSCULAR | Status: DC | PRN
  Administered 2023-06-29: 10 mg via INTRAVENOUS
  Administered 2023-06-29: 20 mg via INTRAVENOUS

## 2023-06-29 MED ORDER — HYDROMORPHONE HCL 1 MG/ML IJ SOLN
0.2500 mg | INTRAMUSCULAR | Status: DC | PRN
  Administered 2023-06-29 (×2): 0.25 mg via INTRAVENOUS

## 2023-06-29 MED ORDER — LIDOCAINE 2% (20 MG/ML) 5 ML SYRINGE
INTRAMUSCULAR | Status: AC
  Filled 2023-06-29: qty 5

## 2023-06-29 MED ORDER — TIZANIDINE HCL 4 MG PO TABS: 4.0000 mg | ORAL_TABLET | Freq: Four times a day (QID) | ORAL | 0 refills | Status: AC | PRN

## 2023-06-29 MED ORDER — ONDANSETRON HCL 4 MG/2ML IJ SOLN: 4.0000 mg | Freq: Once | INTRAMUSCULAR | Status: DC | PRN

## 2023-06-29 MED ORDER — SUGAMMADEX SODIUM 200 MG/2ML IV SOLN
INTRAVENOUS | Status: DC | PRN
  Administered 2023-06-29: 200 mg via INTRAVENOUS

## 2023-06-29 MED ORDER — PROPOFOL 10 MG/ML IV BOLUS
INTRAVENOUS | Status: DC | PRN
  Administered 2023-06-29: 100 mg via INTRAVENOUS

## 2023-06-29 MED ORDER — LIDOCAINE-EPINEPHRINE 0.5 %-1:200000 IJ SOLN
INTRAMUSCULAR | Status: DC | PRN
  Administered 2023-06-29: 10 mL

## 2023-06-29 MED ORDER — MIDAZOLAM HCL 2 MG/2ML IJ SOLN
INTRAMUSCULAR | Status: DC | PRN
  Administered 2023-06-29: .5 mg via INTRAVENOUS

## 2023-06-29 MED ORDER — BUPIVACAINE HCL (PF) 0.5 % IJ SOLN
INTRAMUSCULAR | Status: AC
  Filled 2023-06-29: qty 30

## 2023-06-29 MED ORDER — KETOROLAC TROMETHAMINE 30 MG/ML IJ SOLN
30.0000 mg | Freq: Once | INTRAMUSCULAR | Status: AC | PRN
Start: 2023-06-29 — End: 2023-06-29
  Administered 2023-06-29: 30 mg via INTRAVENOUS

## 2023-06-29 MED ORDER — 0.9 % SODIUM CHLORIDE (POUR BTL) OPTIME
TOPICAL | Status: DC | PRN
  Administered 2023-06-29: 1000 mL

## 2023-06-29 MED ORDER — ROCURONIUM BROMIDE 10 MG/ML (PF) SYRINGE
PREFILLED_SYRINGE | INTRAVENOUS | Status: DC | PRN
  Administered 2023-06-29: 80 mg via INTRAVENOUS

## 2023-06-29 MED ORDER — OXYCODONE HCL 5 MG/5ML PO SOLN: 5.0000 mg | Freq: Once | ORAL | Status: AC | PRN

## 2023-06-29 SURGICAL SUPPLY — 39 items
BAG COUNTER SPONGE SURGICOUNT (BAG) ×1 IMPLANT
BENZOIN TINCTURE PRP APPL 2/3 (GAUZE/BANDAGES/DRESSINGS) IMPLANT
BLADE CLIPPER SURG (BLADE) IMPLANT
BUR MATCHSTICK NEURO 3.0 LAGG (BURR) ×1 IMPLANT
BUR PRECISION FLUTE 5.0 (BURR) IMPLANT
CANISTER SUCT 3000ML PPV (MISCELLANEOUS) ×1 IMPLANT
DERMABOND ADVANCED .7 DNX12 (GAUZE/BANDAGES/DRESSINGS) ×1 IMPLANT
DRAPE LAPAROTOMY 100X72X124 (DRAPES) ×1 IMPLANT
DRAPE MICROSCOPE SLANT 54X150 (MISCELLANEOUS) ×1 IMPLANT
DRAPE SURG 17X23 STRL (DRAPES) ×1 IMPLANT
DURAPREP 26ML APPLICATOR (WOUND CARE) ×1 IMPLANT
ELECT REM PT RETURN 9FT ADLT (ELECTROSURGICAL) ×1
ELECTRODE REM PT RTRN 9FT ADLT (ELECTROSURGICAL) ×1 IMPLANT
GAUZE 4X4 16PLY ~~LOC~~+RFID DBL (SPONGE) IMPLANT
GAUZE SPONGE 4X4 12PLY STRL (GAUZE/BANDAGES/DRESSINGS) IMPLANT
GLOVE ECLIPSE 6.5 STRL STRAW (GLOVE) ×1 IMPLANT
GLOVE EXAM NITRILE XL STR (GLOVE) IMPLANT
GOWN STRL REUS W/ TWL LRG LVL3 (GOWN DISPOSABLE) ×2 IMPLANT
GOWN STRL REUS W/ TWL XL LVL3 (GOWN DISPOSABLE) IMPLANT
GOWN STRL REUS W/TWL 2XL LVL3 (GOWN DISPOSABLE) IMPLANT
KIT BASIN OR (CUSTOM PROCEDURE TRAY) ×1 IMPLANT
KIT TURNOVER KIT B (KITS) ×1 IMPLANT
NDL HYPO 25X1 1.5 SAFETY (NEEDLE) ×1 IMPLANT
NDL SPNL 18GX3.5 QUINCKE PK (NEEDLE) IMPLANT
NEEDLE HYPO 25X1 1.5 SAFETY (NEEDLE) ×1
NEEDLE SPNL 18GX3.5 QUINCKE PK (NEEDLE)
NS IRRIG 1000ML POUR BTL (IV SOLUTION) ×1 IMPLANT
PACK LAMINECTOMY NEURO (CUSTOM PROCEDURE TRAY) ×1 IMPLANT
PAD ARMBOARD 7.5X6 YLW CONV (MISCELLANEOUS) ×3 IMPLANT
SPIKE FLUID TRANSFER (MISCELLANEOUS) ×1 IMPLANT
SPONGE SURGIFOAM ABS GEL SZ50 (HEMOSTASIS) ×1 IMPLANT
SPONGE T-LAP 4X18 ~~LOC~~+RFID (SPONGE) IMPLANT
STRIP CLOSURE SKIN 1/2X4 (GAUZE/BANDAGES/DRESSINGS) IMPLANT
SUT VIC AB 0 CT1 18XCR BRD8 (SUTURE) ×1 IMPLANT
SUT VIC AB 2-0 CT1 18 (SUTURE) ×1 IMPLANT
SUT VIC AB 3-0 SH 8-18 (SUTURE) ×1 IMPLANT
TOWEL GREEN STERILE (TOWEL DISPOSABLE) ×1 IMPLANT
TOWEL GREEN STERILE FF (TOWEL DISPOSABLE) ×1 IMPLANT
WATER STERILE IRR 1000ML POUR (IV SOLUTION) ×1 IMPLANT

## 2023-06-29 NOTE — Discharge Instructions (Signed)
Lumbar Discectomy Care After A discectomy involves removal of discmaterial (the cartilage-like structures located between the bones of the back). It is done to relieve pressure on nerve roots. It can be used as a treatment for a back problem. The time in surgery depends on the findings in surgery and what is necessary to correct the problems. HOME CARE INSTRUCTIONS   Check the cut (incision) made by the surgeon twice a day for signs of infection. Some signs of infection may include:   A foul smelling, greenish or yellowish discharge from the wound.   Increased pain.   Increased redness over the incision (operative) site.   The skin edges may separate.   Flu-like symptoms (problems).   A temperature above 101.5 F (38.6 C).   Change your bandages in about 24 to 36 hours following surgery or as directed.   You may shower tomrrow.  Avoid bathtubs, swimming pools and hot tubs for three weeks or until your incision has healed completely.  Follow your doctor's instructions as to safe activities, exercises, and physical therapy.   Weight reduction may be beneficial if you are overweight.   Daily exercise is helpful to prevent the return of problems. Walking is permitted. You may use a treadmill without an incline. Cut down on activities and exercise if you have discomfort. You may also go up and down stairs as much as you can tolerate.   DO NOT lift anything heavier than 10 to 15 lbs. Avoid bending or twisting at the waist. Always bend your knees when lifting.   Maintain strength and range of motion as instructed.   Do not drive for 10 days, or as directed by your doctors. You may be a passenger . Lying back in the passenger seat may be more comfortable for you. Always wear a seatbelt.   Limit your sitting in a regular chair to 20 to 30 minutes at a time. There are no limitations for sitting in a recliner. You should lie down or walk in between sitting periods.   Only take  over-the-counter or prescription medicines for pain, discomfort, or fever as directed by your caregiver.  SEEK MEDICAL CARE IF:   There is increased bleeding (more than a small spot) from the wound.   You notice redness, swelling, or increasing pain in the wound.   Pus is coming from wound.   You develop an unexplained oral temperature above 102 F (38.9 C) develops.   You notice a foul smell coming from the wound or dressing.   You have increasing pain in your wound.  SEEK IMMEDIATE MEDICAL CARE IF:   You develop a rash.   You have difficulty breathing.   You develop any allergic problems to medicines given.  Document Released: 04/26/2004 Document Revised: 05/11/2011 Document Reviewed: 08/15/2007 ExitCare Patient Information

## 2023-06-29 NOTE — Anesthesia Preprocedure Evaluation (Addendum)
Anesthesia Evaluation  Patient identified by MRN, date of birth, ID band Patient awake    Reviewed: Allergy & Precautions, H&P , NPO status , Patient's Chart, lab work & pertinent test results  Airway Mallampati: III  TM Distance: >3 FB Neck ROM: Full    Dental  (+) Edentulous Upper, Edentulous Lower   Pulmonary Current Smoker and Patient abstained from smoking. 1/2 ppd   Pulmonary exam normal breath sounds clear to auscultation       Cardiovascular negative cardio ROS Normal cardiovascular exam Rhythm:Regular Rate:Normal     Neuro/Psych  PSYCHIATRIC DISORDERS Anxiety Depression    negative neurological ROS     GI/Hepatic negative GI ROS,,,(+)     substance abuse (once a month)  marijuana use  Endo/Other  negative endocrine ROS    Renal/GU negative Renal ROS  negative genitourinary   Musculoskeletal  (+) Arthritis , Osteoarthritis,    Abdominal   Peds negative pediatric ROS (+)  Hematology negative hematology ROS (+) Hb 15.1   Anesthesia Other Findings   Reproductive/Obstetrics negative OB ROS                             Anesthesia Physical Anesthesia Plan  ASA: 3  Anesthesia Plan: General   Post-op Pain Management: Tylenol PO (pre-op)* and Ketamine IV*   Induction: Intravenous  PONV Risk Score and Plan: 2 and Ondansetron, Dexamethasone, Midazolam and Treatment may vary due to age or medical condition  Airway Management Planned: Oral ETT  Additional Equipment: None  Intra-op Plan:   Post-operative Plan: Extubation in OR  Informed Consent: I have reviewed the patients History and Physical, chart, labs and discussed the procedure including the risks, benefits and alternatives for the proposed anesthesia with the patient or authorized representative who has indicated his/her understanding and acceptance.     Dental advisory given  Plan Discussed with: CRNA  Anesthesia  Plan Comments:         Anesthesia Quick Evaluation

## 2023-06-29 NOTE — Anesthesia Postprocedure Evaluation (Signed)
Anesthesia Post Note  Patient: Loretta [name]  Procedure(s) Performed: Microdiscectomy far lateral - Lumbar three-Lumbar four, left (Left: Spine Lumbar)     Patient location during evaluation: PACU Anesthesia Type: General Level of consciousness: awake and alert Pain management: pain level controlled Vital Signs Assessment: post-procedure vital signs reviewed and stable Respiratory status: spontaneous breathing, nonlabored ventilation, respiratory function stable and patient connected to nasal cannula oxygen Cardiovascular status: blood pressure returned to baseline and stable Postop Assessment: no apparent nausea or vomiting Anesthetic complications: no   There were no known notable events for this encounter.  Last Vitals:  Vitals:   06/29/23 1638 06/29/23 1645  BP: (!) 141/92 (!) 114/96  Pulse: 80 79  Resp: 17 13  Temp: 36.7 C   SpO2: 100% 96%    Last Pain:  Vitals:   06/29/23 1143  TempSrc:   PainSc: 4                  Collene Schlichter

## 2023-06-29 NOTE — H&P (Signed)
   Loretta [name] comes in today for followup from an exam that I had done in September 2020.  At that time, she came in for a pain that she had in her back.  She says whenever she would stand for any period of time, or try to walk for any distance, she would have a lot of pain.  Mother and Father are deceased.  Diabetes present in the family history.  She says the pain is the same now as it was 4 years ago.    On her intake, she underwent a tubal ligation in 1991.  She has no history of substance abuse.  She does have a 25 pack year, at least, history of smoking.   The pain has been present since 2019, so the pain has actually been present for 5 years.  REVIEW OF SYSTEMS: Positive for swelling in the feet and hands, leg pain with walking, leg pain with standing, cataracts, sinus problems, arthritis, neck pain, back pain, arm pain, leg pain, joint pain, excessive thirst, anxiety, depression.  EXAM: Today, she has brisk reflexes, 2 to 3+ at the knees and ankles.  She has intact proprioception.  Reflexes are 2+ at the biceps, triceps, brachioradialis.  Pupils equal, round, and reactive to light.  Full extraocular movements.  Full visual fields.  Hearing intact to voice.  Romberg is negative.  She can toe walk, she can heel walk, and she can perform a squat.  She has normal muscle tone and bulk.  Loretta [name] returns.  The injection did very little to help her.  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  At this point, she says she would like to just go ahead with surgery. Only caveat, she wants to wait until after the new year, which again is very reasonable.

## 2023-06-29 NOTE — Op Note (Signed)
06/29/2023  5:49 PM  PATIENT:  Loretta [name]  53 y.o. 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 PRE-OPERATIVE DIAGNOSIS:  Disc displacement, lumbar three, four  POST-OPERATIVE DIAGNOSIS:  Disc displacement, lumbar three four  PROCEDURE:  Procedure(s): Microdiscectomy far lateral - Lumbar three-Lumbar four, left  SURGEON:   Surgeon(s): Coletta Memos, MD  ASSISTANTS:none  ANESTHESIA:   general  EBL:  Total I/O In: 1000 [I.V.:1000] Out: 20 [Blood:20]  BLOOD ADMINISTERED:none  CELL SAVER GIVEN:not used  COUNT:per nursing  DRAINS: none   SPECIMEN:  No Specimen  DICTATION: [name] was taken to the operating room, intubated and placed under a general anesthetic without difficulty. She was positioned prone on a Wilson frame with all pressure points padded. Her back was prepped and draped in a sterile manner. I opened the skin with a 10 blade and carried the dissection down to the thoracolumbar fascia. I used both sharp dissection and the monopolar cautery to expose the lamina of L3, and L2. I confirmed my location with an intraoperative xray.  I used the drill, Kerrison punches, to remove the lateral portion of the pars at L3. I used the punches to remove the ligamentum flavum, and bone to expose the thecal sac and the L3 nerve root hugging the pars. I brought the microscope into the operative field and started the decompression of the spinal canal, thecal sac and L3 root(s). I cauterized epidural veins overlying the disc space then divided them sharply. I opened the disc space with a 15 blade and proceeded with the discectomy. I used pituitary rongeurs, curettes, and other instruments to remove disc material. After the discectomy was completed I inspected the L3 nerve root and felt it was well decompressed. I explored rostrally, laterally, medially, and caudally and was satisfied with the decompression. I irrigated  the wound, then closed in layers. I approximated the thoracolumbar fascia, subcutaneous, and subcuticular planes with vicryl sutures. I used dermabond for a sterile dressing.   PLAN OF CARE: Discharge to home after PACU  PATIENT DISPOSITION:  PACU - hemodynamically stable.   Delay start of Pharmacological VTE agent (>24hrs) due to surgical blood loss or risk of bleeding:  yes

## 2023-06-29 NOTE — Transfer of Care (Signed)
Immediate Anesthesia Transfer of Care Note  Patient: Loretta [name]  Procedure(s) Performed: Microdiscectomy far lateral - Lumbar three-Lumbar four, left (Left: Spine Lumbar)  Patient Location: PACU  Anesthesia Type:General  Level of Consciousness: awake, alert , and oriented  Airway & Oxygen Therapy: Patient connected to face mask oxygen  Post-op Assessment: Report given to RN and Post -op Vital signs reviewed and stable  Post vital signs: Reviewed and stable  Last Vitals:  Vitals Value Taken Time  BP 141/92 06/29/23 1638  Temp    Pulse 81 06/29/23 1639  Resp 20 06/29/23 1639  SpO2 100 % 06/29/23 1639  Vitals shown include unfiled device data.  Last Pain:  Vitals:   06/29/23 1143  TempSrc:   PainSc: 4          Complications: There were no known notable events for this encounter.

## 2023-06-29 NOTE — Anesthesia Procedure Notes (Addendum)
Procedure Name: Intubation Date/Time: 06/29/2023 3:00 PM  Performed by: Sharyn Dross, CRNAPre-anesthesia Checklist: Patient identified, Emergency Drugs available, Suction available and Patient being monitored Patient Re-evaluated:Patient Re-evaluated prior to induction Oxygen Delivery Method: Circle system utilized Preoxygenation: Pre-oxygenation with 100% oxygen Induction Type: IV induction Ventilation: Mask ventilation without difficulty and Oral airway inserted - appropriate to patient size Laryngoscope Size: Mac and 4 Grade View: Grade I Tube type: Oral Tube size: 7.0 mm Number of attempts: 1 Airway Equipment and Method: Stylet and Oral airway Placement Confirmation: ETT inserted through vocal cords under direct vision, positive ETCO2 and breath sounds checked- equal and bilateral Secured at: 22 cm Tube secured with: Tape Dental Injury: Teeth and Oropharynx as per pre-operative assessment

## 2023-06-30 ENCOUNTER — Encounter (HOSPITAL_COMMUNITY): Payer: Self-pay | Admitting: Neurosurgery

## 2023-09-19 DIAGNOSIS — M5126 Other intervertebral disc displacement, lumbar region: Secondary | ICD-10-CM | POA: Diagnosis not present

## 2023-10-30 DIAGNOSIS — M5126 Other intervertebral disc displacement, lumbar region: Secondary | ICD-10-CM | POA: Diagnosis not present

## 2023-10-30 DIAGNOSIS — Z6828 Body mass index (BMI) 28.0-28.9, adult: Secondary | ICD-10-CM | POA: Diagnosis not present

## 2023-11-13 DIAGNOSIS — M48062 Spinal stenosis, lumbar region with neurogenic claudication: Secondary | ICD-10-CM | POA: Diagnosis not present

## 2023-12-03 DIAGNOSIS — M48062 Spinal stenosis, lumbar region with neurogenic claudication: Secondary | ICD-10-CM | POA: Diagnosis not present

## 2023-12-03 DIAGNOSIS — Z6827 Body mass index (BMI) 27.0-27.9, adult: Secondary | ICD-10-CM | POA: Diagnosis not present

## 2024-02-07 DIAGNOSIS — Z6828 Body mass index (BMI) 28.0-28.9, adult: Secondary | ICD-10-CM | POA: Diagnosis not present

## 2024-02-07 DIAGNOSIS — M48062 Spinal stenosis, lumbar region with neurogenic claudication: Secondary | ICD-10-CM | POA: Diagnosis not present

## 2024-02-20 ENCOUNTER — Ambulatory Visit: Attending: Neurosurgery | Admitting: Physical Therapy

## 2024-02-20 ENCOUNTER — Other Ambulatory Visit: Payer: Self-pay

## 2024-02-20 ENCOUNTER — Encounter: Payer: Self-pay | Admitting: Physical Therapy

## 2024-02-20 DIAGNOSIS — M5459 Other low back pain: Secondary | ICD-10-CM | POA: Insufficient documentation

## 2024-02-20 DIAGNOSIS — R293 Abnormal posture: Secondary | ICD-10-CM | POA: Diagnosis present

## 2024-02-20 DIAGNOSIS — M62830 Muscle spasm of back: Secondary | ICD-10-CM | POA: Insufficient documentation

## 2024-02-20 NOTE — Therapy (Signed)
 OUTPATIENT PHYSICAL THERAPY THORACOLUMBAR EVALUATION   Patient Name: Loretta [name] MRN: [id number] DOB:August 08, 1970, 53 y.o., female Today's Date: 02/20/2024  END OF SESSION:  PT End of Session - 02/20/24 1411     Visit Number 1    Number of Visits 12    Date for PT Re-Evaluation 04/02/24    PT Start Time 0143    PT Stop Time 0205    PT Time Calculation (min) 22 min    Activity Tolerance Patient tolerated treatment well    Behavior During Therapy [facility] for tasks assessed/performed          Past Medical History:  Diagnosis Date   Anxiety    Arthritis    Depression    Past Surgical History:  Procedure Laterality Date   ESOPHAGOGASTRODUODENOSCOPY  02/12/2012   Abnormal gastric mucosa with submucosal petechiae and antral erosions as described above-status post biopsy. Small hiatal hernia. Bx showed reactive changes with focal erosion and slight inflammation but no H.pylori.    LUMBAR LAMINECTOMY/DECOMPRESSION MICRODISCECTOMY Left 06/29/2023   Procedure: Microdiscectomy far lateral - Lumbar three-Lumbar four, left;  Surgeon: Gillie Duncans, MD;  Location: [facility] OR;  Service: Neurosurgery;  Laterality: Left;   TUBAL LIGATION     Patient Active Problem List   Diagnosis Date Noted   Gastritis 03/13/2012   Epigastric pain 01/23/2012   REFERRING PROVIDER: Duncans Gillie MD  REFERRING DIAG: Lumbar stenosis with neurogenic claudication.  Rationale for Evaluation and Treatment: Rehabilitation  THERAPY DIAG:  Other low back pain  Abnormal posture  Muscle spasm of back  ONSET DATE: Ongoing, surgery (06/29/23).    SUBJECTIVE:                                                                                                                                                                                           SUBJECTIVE STATEMENT: The patient presents to the clinic with c/o low back pain that is across her low back and radiates into her right buttock region.  Today, she rates her pain  at a 5/10.  Lying down and uses ice and medications help decrease her pain.  She reports there are times her pain is much higher.  She states that standing greater than 10 minutes produces high pain-levels.  She describes the pain as an ache, throbbing an dshooting.    PERTINENT HISTORY:  Lumbar laminectomy/decompression microdiskectomy.    PAIN:  Are you having pain? Yes: NPRS scale: 5/10. Pain location: LB, right > left.   Pain description: As above. Aggravating factors: As above.   Relieving factors: As above.    PRECAUTIONS: Fall.  Highly recommended she use  a cane for additional safety.  She has access to a cane.    RED FLAGS: None   WEIGHT BEARING RESTRICTIONS: No  FALLS:  Has patient fallen in last 6 months? Yes. Number of falls 3.    LIVING ENVIRONMENT: Lives with: lives with their family Lives in: House/apartment Stairs: I external step.   Has following equipment at home: None  PLOF: Independent with basic ADLs  PATIENT GOALS: Decrease pain and do more.    OBJECTIVE:   PATIENT SURVEYS:  ODI:  40/50.   POSTURE: rounded shoulders, forward head, and flexed trunk .  She stands in 15 degrees of trunk flexion.    PALPATION: Patient c/o pain across her low back region.  She c/o significant tenderness over her right lumbar musculature which exhibits a great deal of tone.    LUMBAR ROM:   Active lumbar flexion limited by 50% and extension limited to 0 degrees.  LOWER EXTREMITY ROM:     WFL.  LOWER EXTREMITY MMT:    Normal bilateral LE strength.    GAIT: The patient's gait is antalgic and she walks slowly and purposefully in a flexed trunk posture.    TREATMENT DATE:                                                                                                                                  PATIENT EDUCATION:  Education details: Discussed using a cane for addition safety. Person educated: Patient Education method: Software engineer Education comprehension: verbalized understanding  HOME EXERCISE PROGRAM:   ASSESSMENT:  CLINICAL IMPRESSION: The patient presents to OPPT with chronic low back pain and s/p lumbar surgery performed on 06/29/23.  She states that her pain prohibits her from performing ADL's like she wants to to.  She cannot stand for prolonged periods of time.  She stands in 15 degrees of truck flexion.  She c/o pain across her lower lumbar region with pain greater on the right.  Her right lumbar musculature is tender to palpation with a great deal of tone.  Her ODI score is 40/50.  Highly recommended patient use a cane.  OBJECTIVE IMPAIRMENTS: Abnormal gait, decreased activity tolerance, decreased ROM, increased muscle spasms, postural dysfunction, and pain.   ACTIVITY LIMITATIONS: carrying, lifting, bending, sitting, standing, and locomotion level  PARTICIPATION LIMITATIONS: meal prep, cleaning, laundry, shopping, community activity, and yard work  PERSONAL FACTORS: Time since onset of injury/illness/exacerbation and 1 comorbidity: prior lumbar surgery are also affecting patient's functional outcome.   REHAB POTENTIAL: Fair plus/good-  CLINICAL DECISION MAKING: Evolving/moderate complexity  EVALUATION COMPLEXITY: Low   GOALS:  SHORT TERM GOALS: Target date: 03/05/24.  Ind with an HEP. Goal status: INITIAL   LONG TERM GOALS: Target date: 04/02/24  Stand 20 minutes with pain not > 4/10.  Goal status: INITIAL  2.  Walk in upright posture with normal gait cycle with a straight cane.  Goal status: INITIAL  3.  Perform ADL's with pain not  4/10.  Goal status: INITIAL  4.  Improve ODI score by at least 10 points.  Goal status: INITIAL   PLAN:  PT FREQUENCY: 2x/week  PT DURATION: 6 weeks  PLANNED INTERVENTIONS: 97110-Therapeutic exercises, 97530- Therapeutic activity, [id number]- Neuromuscular re-education, 97535- Self Care, 02859- Manual therapy, G0283- Electrical stimulation  (unattended), 97035- Ultrasound, 79439 (1-2 muscles), 20561 (3+ muscles)- Dry Needling, Patient/Family education, Cryotherapy, and Moist heat.  PLAN FOR NEXT SESSION: Nustep, SKTC, core exercise progression, STW/M and modalities as needed.     Irelynn Schermerhorn, [place], PT 02/20/2024, 2:38 PM

## 2024-02-21 DIAGNOSIS — R5383 Other fatigue: Secondary | ICD-10-CM | POA: Diagnosis not present

## 2024-02-21 DIAGNOSIS — J302 Other seasonal allergic rhinitis: Secondary | ICD-10-CM | POA: Diagnosis not present

## 2024-02-25 ENCOUNTER — Ambulatory Visit: Admitting: Physical Therapy

## 2024-02-25 ENCOUNTER — Encounter: Payer: Self-pay | Admitting: Physical Therapy

## 2024-02-25 DIAGNOSIS — M5459 Other low back pain: Secondary | ICD-10-CM | POA: Diagnosis not present

## 2024-02-25 DIAGNOSIS — M62830 Muscle spasm of back: Secondary | ICD-10-CM

## 2024-02-25 DIAGNOSIS — R293 Abnormal posture: Secondary | ICD-10-CM

## 2024-02-25 NOTE — Therapy (Signed)
 OUTPATIENT PHYSICAL THERAPY THORACOLUMBAR EVALUATION   Patient Name: Loretta [name] MRN: [id number] DOB:22-Dec-1970, 53 y.o., female Today's Date: 02/25/2024  END OF SESSION:  PT End of Session - 02/25/24 1257     Visit Number 2    Number of Visits 12    Date for Recertification  04/02/24    PT Start Time 1300    PT Stop Time 1340    PT Time Calculation (min) 40 min    Activity Tolerance Patient tolerated treatment well    Behavior During Therapy [facility] for tasks assessed/performed           Past Medical History:  Diagnosis Date   Anxiety    Arthritis    Depression    Past Surgical History:  Procedure Laterality Date   ESOPHAGOGASTRODUODENOSCOPY  02/12/2012   Abnormal gastric mucosa with submucosal petechiae and antral erosions as described above-status post biopsy. Small hiatal hernia. Bx showed reactive changes with focal erosion and slight inflammation but no H.pylori.    LUMBAR LAMINECTOMY/DECOMPRESSION MICRODISCECTOMY Left 06/29/2023   Procedure: Microdiscectomy far lateral - Lumbar three-Lumbar four, left;  Surgeon: Gillie Duncans, MD;  Location: [facility] OR;  Service: Neurosurgery;  Laterality: Left;   TUBAL LIGATION     Patient Active Problem List   Diagnosis Date Noted   Gastritis 03/13/2012   Epigastric pain 01/23/2012   REFERRING PROVIDER: Duncans Gillie MD  REFERRING DIAG: Lumbar stenosis with neurogenic claudication.  Rationale for Evaluation and Treatment: Rehabilitation  THERAPY DIAG:  Other low back pain  Abnormal posture  Muscle spasm of back  ONSET DATE: Ongoing, surgery (06/29/23).    SUBJECTIVE:                                                                                                                                                                                           SUBJECTIVE STATEMENT: Pt states she isn't sure what she did but feels she pulled her right back hip. It started hurting more on Friday.   PERTINENT HISTORY:  Lumbar  laminectomy/decompression microdiskectomy.    PAIN:  Are you having pain? Yes: NPRS scale: 6/10. Pain location: LB, right > left.   Pain description: low back pain that is across her low back and radiates into her right buttock region; ache, throbbing and shooting.   Aggravating factors: standing greater than 10 minutes  Relieving factors: Lying down and uses ice and medications help decrease her pain    PRECAUTIONS: Fall.  Highly recommended she use a cane for additional safety.  She has access to a cane.    RED FLAGS: None   WEIGHT BEARING RESTRICTIONS: No  FALLS:  Has patient fallen in last 6 months? Yes. Number of falls 3.    LIVING ENVIRONMENT: Lives with: lives with their family Lives in: House/apartment Stairs: I external step.   Has following equipment at home: None  PLOF: Independent with basic ADLs  PATIENT GOALS: Decrease pain and do more.    OBJECTIVE:   PATIENT SURVEYS:  ODI:  40/50.   POSTURE: rounded shoulders, forward head, and flexed trunk .  She stands in 15 degrees of trunk flexion.    PALPATION: Patient c/o pain across her low back region.  She c/o significant tenderness over her right lumbar musculature which exhibits a great deal of tone.    LUMBAR ROM:  Active lumbar flexion limited by 50% and extension limited to 0 degrees.  LOWER EXTREMITY ROM:   WFL.  LOWER EXTREMITY MMT:   Normal bilateral LE strength.    GAIT: The patient's gait is antalgic and she walks slowly and purposefully in a flexed trunk posture.    TREATMENT DATE: 02/25/24 Nustep L5 x 6 min UEs/LEs                                                                                                                        Sidelying knee to chest stretch x 30 Sidelying hip flexion x10 Sidelying clamshell 2x10 Sidelying reverse clamshell 2x10 Supine piriformis stretch x30 Supine LTR x10 Supine PPT + TA activation + diaphragmatic breaths x10 Manual therapy: STM & TPR R sacral  paraspinals and glute with ice Sitting hip hinge with pball x10 Standing lumbar ext against counter x10  PATIENT EDUCATION:  Education details: Discussed using a cane for addition safety. Person educated: Patient Education method: Medical illustrator Education comprehension: verbalized understanding  HOME EXERCISE PROGRAM: Access Code: [email or web address] URL: [email or web address] Date: 02/25/2024 Prepared by: Kysa Calais April Earnie Starring  Exercises - Supine Lower Trunk Rotation  - 1 x daily - 7 x weekly - 1 sets - 10 reps - Supine Piriformis Stretch with Foot on Ground  - 1 x daily - 7 x weekly - 2 sets - 30 sec hold - Supine Bridge  - 1 x daily - 7 x weekly - 2 sets - 10 reps - Supine Posterior Pelvic Tilt  - 1 x daily - 7 x weekly - 2 sets - 10 reps - 3 sec hold - Clamshell  - 1 x daily - 7 x weekly - 2 sets - 10 reps  ASSESSMENT:  CLINICAL IMPRESSION: Pt comes in with increased pain -- feels it mostly when coming from sitting<>standing. Performed manual work to address increased sacral paraspinals/glutes. Initiated core and hip strengthening. Worked on hip hinging to reduce lumbar compensations.   OBJECTIVE IMPAIRMENTS: Abnormal gait, decreased activity tolerance, decreased ROM, increased muscle spasms, postural dysfunction, and pain.   ACTIVITY LIMITATIONS: carrying, lifting, bending, sitting, standing, and locomotion level  PARTICIPATION LIMITATIONS: meal prep, cleaning, laundry, shopping, community activity, and yard work  PERSONAL FACTORS: Time since onset of injury/illness/exacerbation and 1 comorbidity: prior  lumbar surgery are also affecting patient's functional outcome.   REHAB POTENTIAL: Fair plus/good-  CLINICAL DECISION MAKING: Evolving/moderate complexity  EVALUATION COMPLEXITY: Low   GOALS:  SHORT TERM GOALS: Target date: 03/05/24.  Ind with an HEP. Goal status: INITIAL   LONG TERM GOALS: Target date: 04/02/24  Stand 20 minutes with pain  not > 4/10.  Goal status: INITIAL  2.  Walk in upright posture with normal gait cycle with a straight cane.  Goal status: INITIAL  3.  Perform ADL's with pain not  4/10.  Goal status: INITIAL  4.  Improve ODI score by at least 10 points.  Goal status: INITIAL   PLAN:  PT FREQUENCY: 2x/week  PT DURATION: 6 weeks  PLANNED INTERVENTIONS: 97110-Therapeutic exercises, 97530- Therapeutic activity, [id number]- Neuromuscular re-education, 97535- Self Care, 02859- Manual therapy, G0283- Electrical stimulation (unattended), 97035- Ultrasound, 79439 (1-2 muscles), 20561 (3+ muscles)- Dry Needling, Patient/Family education, Cryotherapy, and Moist heat.  PLAN FOR NEXT SESSION: Nustep, SKTC, core exercise progression, STW/M and modalities as needed.     Nicosha Struve April Ma L Kassadee Carawan, PT, DPT 02/25/2024, 12:57 PM

## 2024-02-28 ENCOUNTER — Encounter: Admitting: *Deleted

## 2024-03-03 ENCOUNTER — Ambulatory Visit

## 2024-03-03 DIAGNOSIS — M5459 Other low back pain: Secondary | ICD-10-CM

## 2024-03-03 DIAGNOSIS — R293 Abnormal posture: Secondary | ICD-10-CM

## 2024-03-03 DIAGNOSIS — M62830 Muscle spasm of back: Secondary | ICD-10-CM

## 2024-03-03 NOTE — Therapy (Signed)
 OUTPATIENT PHYSICAL THERAPY THORACOLUMBAR TREATMENT   Patient Name: Loretta [name] MRN: [id number] DOB:03-20-1971, 53 y.o., female Today's Date: 03/03/2024  END OF SESSION:  PT End of Session - 03/03/24 1303     Visit Number 3    Number of Visits 12    Date for Recertification  04/02/24    PT Start Time 1300    Activity Tolerance Patient tolerated treatment well    Behavior During Therapy [facility] for tasks assessed/performed           Past Medical History:  Diagnosis Date   Anxiety    Arthritis    Depression    Past Surgical History:  Procedure Laterality Date   ESOPHAGOGASTRODUODENOSCOPY  02/12/2012   Abnormal gastric mucosa with submucosal petechiae and antral erosions as described above-status post biopsy. Small hiatal hernia. Bx showed reactive changes with focal erosion and slight inflammation but no H.pylori.    LUMBAR LAMINECTOMY/DECOMPRESSION MICRODISCECTOMY Left 06/29/2023   Procedure: Microdiscectomy far lateral - Lumbar three-Lumbar four, left;  Surgeon: Gillie Duncans, MD;  Location: [facility] OR;  Service: Neurosurgery;  Laterality: Left;   TUBAL LIGATION     Patient Active Problem List   Diagnosis Date Noted   Gastritis 03/13/2012   Epigastric pain 01/23/2012   REFERRING PROVIDER: Duncans Gillie MD  REFERRING DIAG: Lumbar stenosis with neurogenic claudication.  Rationale for Evaluation and Treatment: Rehabilitation  THERAPY DIAG:  Other low back pain  Abnormal posture  Muscle spasm of back  ONSET DATE: Ongoing, surgery (06/29/23).    SUBJECTIVE:                                                                                                                                                                                           SUBJECTIVE STATEMENT: Pt reports 5/10 low back pain today. Pt states the pulled muscle she had last week is feeling much better  PERTINENT HISTORY:  Lumbar laminectomy/decompression microdiskectomy.    PAIN:  Are you having pain?  Yes: NPRS scale: 5/10. Pain location: LB, right > left.   Pain description: low back pain that is across her low back and radiates into her right buttock region; ache, throbbing and shooting.   Aggravating factors: standing greater than 10 minutes  Relieving factors: Lying down and uses ice and medications help decrease her pain    PRECAUTIONS: Fall.  Highly recommended she use a cane for additional safety.  She has access to a cane.    RED FLAGS: None   WEIGHT BEARING RESTRICTIONS: No  FALLS:  Has patient fallen in last 6 months? Yes. Number of falls 3.    LIVING ENVIRONMENT: Lives  with: lives with their family Lives in: House/apartment Stairs: I external step.   Has following equipment at home: None  PLOF: Independent with basic ADLs  PATIENT GOALS: Decrease pain and do more.    OBJECTIVE:   PATIENT SURVEYS:  ODI:  40/50.   POSTURE: rounded shoulders, forward head, and flexed trunk .  She stands in 15 degrees of trunk flexion.    PALPATION: Patient c/o pain across her low back region.  She c/o significant tenderness over her right lumbar musculature which exhibits a great deal of tone.    LUMBAR ROM:  Active lumbar flexion limited by 50% and extension limited to 0 degrees.  LOWER EXTREMITY ROM:   WFL.  LOWER EXTREMITY MMT:   Normal bilateral LE strength.    GAIT: The patient's gait is antalgic and she walks slowly and purposefully in a flexed trunk posture.    TREATMENT DATE:   03/03/24                                EXERCISE LOG  Exercise Repetitions and Resistance Comments  Nustep Lvl 1 x 16 mins   Ball Rollout  4 mins   [employer] 4 mins        Blank cell = exercise not performed today   Manual Therapy Soft Tissue Mobilization: Right low back, STW/M to right lumbar paraspinals and right glute to decrease pain and tone   Modalities  Date:  Unattended Estim: Lumbar, IFC 80-150 hz, 15 mins, Pain and Tone   02/25/24 Nustep L5 x 6 min UEs/LEs                                                                                                                         Sidelying knee to chest stretch x 30 Sidelying hip flexion x10 Sidelying clamshell 2x10 Sidelying reverse clamshell 2x10 Supine piriformis stretch x30 Supine LTR x10 Supine PPT + TA activation + diaphragmatic breaths x10 Manual therapy: STM & TPR R sacral paraspinals and glute with ice Sitting hip hinge with pball x10 Standing lumbar ext against counter x10  PATIENT EDUCATION:  Education details: Discussed using a cane for addition safety. Person educated: Patient Education method: Medical illustrator Education comprehension: verbalized understanding  HOME EXERCISE PROGRAM: Access Code: [email or web address] URL: [email or web address] Date: 02/25/2024 Prepared by: Gellen April Earnie Starring  Exercises - Supine Lower Trunk Rotation  - 1 x daily - 7 x weekly - 1 sets - 10 reps - Supine Piriformis Stretch with Foot on Ground  - 1 x daily - 7 x weekly - 2 sets - 30 sec hold - Supine Bridge  - 1 x daily - 7 x weekly - 2 sets - 10 reps - Supine Posterior Pelvic Tilt  - 1 x daily - 7 x weekly - 2 sets - 10 reps - 3 sec hold - Clamshell  - 1 x daily - 7 x weekly -  2 sets - 10 reps  ASSESSMENT:  CLINICAL IMPRESSION: Pt arrives for today's treatment session reporting 5/10 right low back pain.  Pt states that the pulled muscle feeling is much better than at last visit.  Pt with good stretch during seated physio-ball stretches.  STW/M performed to right lumbar musculature and glute to decrease pain and tone.  Normal responses to estim noted upon removal.  Pt reported 2/10 right low back pain at completion of today's treatment session.   OBJECTIVE IMPAIRMENTS: Abnormal gait, decreased activity tolerance, decreased ROM, increased muscle spasms, postural dysfunction, and pain.   ACTIVITY LIMITATIONS: carrying, lifting, bending, sitting, standing, and locomotion  level  PARTICIPATION LIMITATIONS: meal prep, cleaning, laundry, shopping, community activity, and yard work  PERSONAL FACTORS: Time since onset of injury/illness/exacerbation and 1 comorbidity: prior lumbar surgery are also affecting patient's functional outcome.   REHAB POTENTIAL: Fair plus/good-  CLINICAL DECISION MAKING: Evolving/moderate complexity  EVALUATION COMPLEXITY: Low   GOALS:  SHORT TERM GOALS: Target date: 03/05/24.  Ind with an HEP. Goal status: INITIAL   LONG TERM GOALS: Target date: 04/02/24  Stand 20 minutes with pain not > 4/10.  Goal status: INITIAL  2.  Walk in upright posture with normal gait cycle with a straight cane.  Goal status: INITIAL  3.  Perform ADL's with pain not  4/10.  Goal status: INITIAL  4.  Improve ODI score by at least 10 points.  Goal status: INITIAL   PLAN:  PT FREQUENCY: 2x/week  PT DURATION: 6 weeks  PLANNED INTERVENTIONS: 97110-Therapeutic exercises, 97530- Therapeutic activity, [id number]- Neuromuscular re-education, 97535- Self Care, 02859- Manual therapy, G0283- Electrical stimulation (unattended), 97035- Ultrasound, 79439 (1-2 muscles), 20561 (3+ muscles)- Dry Needling, Patient/Family education, Cryotherapy, and Moist heat.  PLAN FOR NEXT SESSION: Nustep, SKTC, core exercise progression, STW/M and modalities as needed.     Delon DELENA Gosling, PTA, DPT 03/03/2024, 1:52 PM

## 2024-03-05 ENCOUNTER — Ambulatory Visit: Attending: Neurosurgery

## 2024-03-05 DIAGNOSIS — M5459 Other low back pain: Secondary | ICD-10-CM | POA: Diagnosis present

## 2024-03-05 DIAGNOSIS — R293 Abnormal posture: Secondary | ICD-10-CM | POA: Diagnosis present

## 2024-03-05 DIAGNOSIS — M62830 Muscle spasm of back: Secondary | ICD-10-CM | POA: Diagnosis present

## 2024-03-05 NOTE — Therapy (Signed)
 OUTPATIENT PHYSICAL THERAPY THORACOLUMBAR TREATMENT   Patient Name: Loretta [name] MRN: [id number] DOB:11/20/1970, 53 y.o., female Today's Date: 03/05/2024  END OF SESSION:  PT End of Session - 03/05/24 1522     Visit Number 4    Number of Visits 12    Date for Recertification  04/02/24    PT Start Time 1515    PT Stop Time 1609    PT Time Calculation (min) 54 min    Activity Tolerance Patient tolerated treatment well    Behavior During Therapy [facility] for tasks assessed/performed           Past Medical History:  Diagnosis Date   Anxiety    Arthritis    Depression    Past Surgical History:  Procedure Laterality Date   ESOPHAGOGASTRODUODENOSCOPY  02/12/2012   Abnormal gastric mucosa with submucosal petechiae and antral erosions as described above-status post biopsy. Small hiatal hernia. Bx showed reactive changes with focal erosion and slight inflammation but no H.pylori.    LUMBAR LAMINECTOMY/DECOMPRESSION MICRODISCECTOMY Left 06/29/2023   Procedure: Microdiscectomy far lateral - Lumbar three-Lumbar four, left;  Surgeon: Gillie Duncans, MD;  Location: [facility] OR;  Service: Neurosurgery;  Laterality: Left;   TUBAL LIGATION     Patient Active Problem List   Diagnosis Date Noted   Gastritis 03/13/2012   Epigastric pain 01/23/2012   REFERRING PROVIDER: Duncans Gillie MD  REFERRING DIAG: Lumbar stenosis with neurogenic claudication.  Rationale for Evaluation and Treatment: Rehabilitation  THERAPY DIAG:  Other low back pain  Abnormal posture  Muscle spasm of back  ONSET DATE: Ongoing, surgery (06/29/23).    SUBJECTIVE:                                                                                                                                                                                           SUBJECTIVE STATEMENT: Pt reports 6/10 low back pain today. Pt states she felt relief from last treatment until the next morning.   PERTINENT HISTORY:  Lumbar  laminectomy/decompression microdiskectomy.    PAIN:  Are you having pain? Yes: NPRS scale: 6/10. Pain location: LB, right > left.   Pain description: low back pain that is across her low back and radiates into her right buttock region; ache, throbbing and shooting.   Aggravating factors: standing greater than 10 minutes  Relieving factors: Lying down and uses ice and medications help decrease her pain    PRECAUTIONS: Fall.  Highly recommended she use a cane for additional safety.  She has access to a cane.    RED FLAGS: None   WEIGHT BEARING RESTRICTIONS: No  FALLS:  Has patient  fallen in last 6 months? Yes. Number of falls 3.    LIVING ENVIRONMENT: Lives with: lives with their family Lives in: House/apartment Stairs: I external step.   Has following equipment at home: None  PLOF: Independent with basic ADLs  PATIENT GOALS: Decrease pain and do more.    OBJECTIVE:   PATIENT SURVEYS:  ODI:  40/50.   POSTURE: rounded shoulders, forward head, and flexed trunk .  She stands in 15 degrees of trunk flexion.    PALPATION: Patient c/o pain across her low back region.  She c/o significant tenderness over her right lumbar musculature which exhibits a great deal of tone.    LUMBAR ROM:  Active lumbar flexion limited by 50% and extension limited to 0 degrees.  LOWER EXTREMITY ROM:   WFL.  LOWER EXTREMITY MMT:   Normal bilateral LE strength.    GAIT: The patient's gait is antalgic and she walks slowly and purposefully in a flexed trunk posture.    TREATMENT DATE:  03/05/24                                EXERCISE LOG  Exercise Repetitions and Resistance Comments  Nustep Lvl 3 x 16 mins   Ball Rollout  4.5 mins   [employer] 4.5 mins        Blank cell = exercise not performed today   Manual Therapy Soft Tissue Mobilization: Right low back, STW/M to right lumbar paraspinals and right glute to decrease pain and tone   Modalities  Date:  Unattended Estim: Lumbar,  IFC 80-150 hz, 15 mins, Pain and Tone   02/25/24 Nustep L5 x 6 min UEs/LEs                                                                                                                        Sidelying knee to chest stretch x 30 Sidelying hip flexion x10 Sidelying clamshell 2x10 Sidelying reverse clamshell 2x10 Supine piriformis stretch x30 Supine LTR x10 Supine PPT + TA activation + diaphragmatic breaths x10 Manual therapy: STM & TPR R sacral paraspinals and glute with ice Sitting hip hinge with pball x10 Standing lumbar ext against counter x10  PATIENT EDUCATION:  Education details: Discussed using a cane for addition safety. Person educated: Patient Education method: Medical illustrator Education comprehension: verbalized understanding  HOME EXERCISE PROGRAM: Access Code: [email or web address] URL: [email or web address] Date: 02/25/2024 Prepared by: Gellen April Earnie Starring  Exercises - Supine Lower Trunk Rotation  - 1 x daily - 7 x weekly - 1 sets - 10 reps - Supine Piriformis Stretch with Foot on Ground  - 1 x daily - 7 x weekly - 2 sets - 30 sec hold - Supine Bridge  - 1 x daily - 7 x weekly - 2 sets - 10 reps - Supine Posterior Pelvic Tilt  - 1 x daily - 7 x weekly - 2 sets - 10 reps -  3 sec hold - Clamshell  - 1 x daily - 7 x weekly - 2 sets - 10 reps  ASSESSMENT:  CLINICAL IMPRESSION: Pt arrives for today's treatment session reporting 6/10 right low back pain.  Pt able to tolerate increased resistance on the Nustep today without complaint of pain.  Pt also able to tolerate increased time with physio-ball exercises today.  STW/M performed to right lumbar musculature to decrease pain and tone.  Normal responses to estim and MH noted upon removal.  Pt reported 2/10 right low back pain at completion of today's treatment session.   OBJECTIVE IMPAIRMENTS: Abnormal gait, decreased activity tolerance, decreased ROM, increased muscle spasms, postural dysfunction,  and pain.   ACTIVITY LIMITATIONS: carrying, lifting, bending, sitting, standing, and locomotion level  PARTICIPATION LIMITATIONS: meal prep, cleaning, laundry, shopping, community activity, and yard work  PERSONAL FACTORS: Time since onset of injury/illness/exacerbation and 1 comorbidity: prior lumbar surgery are also affecting patient's functional outcome.   REHAB POTENTIAL: Fair plus/good-  CLINICAL DECISION MAKING: Evolving/moderate complexity  EVALUATION COMPLEXITY: Low   GOALS:  SHORT TERM GOALS: Target date: 03/05/24.  Ind with an HEP. Goal status: INITIAL   LONG TERM GOALS: Target date: 04/02/24  Stand 20 minutes with pain not > 4/10.  Goal status: INITIAL  2.  Walk in upright posture with normal gait cycle with a straight cane.  Goal status: INITIAL  3.  Perform ADL's with pain not  4/10.  Goal status: INITIAL  4.  Improve ODI score by at least 10 points.  Goal status: INITIAL   PLAN:  PT FREQUENCY: 2x/week  PT DURATION: 6 weeks  PLANNED INTERVENTIONS: 97110-Therapeutic exercises, 97530- Therapeutic activity, [id number]- Neuromuscular re-education, 97535- Self Care, 02859- Manual therapy, G0283- Electrical stimulation (unattended), 97035- Ultrasound, 79439 (1-2 muscles), 20561 (3+ muscles)- Dry Needling, Patient/Family education, Cryotherapy, and Moist heat.  PLAN FOR NEXT SESSION: Nustep, SKTC, core exercise progression, STW/M and modalities as needed.     Delon DELENA Gosling, PTA, DPT 03/05/2024, 4:09 PM

## 2024-03-10 ENCOUNTER — Ambulatory Visit: Admitting: Physical Therapy

## 2024-03-10 ENCOUNTER — Encounter: Payer: Self-pay | Admitting: Physical Therapy

## 2024-03-10 DIAGNOSIS — M62830 Muscle spasm of back: Secondary | ICD-10-CM

## 2024-03-10 DIAGNOSIS — R293 Abnormal posture: Secondary | ICD-10-CM

## 2024-03-10 DIAGNOSIS — M5459 Other low back pain: Secondary | ICD-10-CM | POA: Diagnosis not present

## 2024-03-10 NOTE — Therapy (Signed)
 OUTPATIENT PHYSICAL THERAPY THORACOLUMBAR TREATMENT   Patient Name: Loretta [name] MRN: [id number] DOB:Jun 04, 1971, 53 y.o., female Today's Date: 03/10/2024  END OF SESSION:  PT End of Session - 03/10/24 1520     Visit Number 5    Number of Visits 12    Date for Recertification  04/02/24    PT Start Time 0315    PT Stop Time 0406    PT Time Calculation (min) 51 min    Activity Tolerance Patient tolerated treatment well    Behavior During Therapy [facility] for tasks assessed/performed           Past Medical History:  Diagnosis Date   Anxiety    Arthritis    Depression    Past Surgical History:  Procedure Laterality Date   ESOPHAGOGASTRODUODENOSCOPY  02/12/2012   Abnormal gastric mucosa with submucosal petechiae and antral erosions as described above-status post biopsy. Small hiatal hernia. Bx showed reactive changes with focal erosion and slight inflammation but no H.pylori.    LUMBAR LAMINECTOMY/DECOMPRESSION MICRODISCECTOMY Left 06/29/2023   Procedure: Microdiscectomy far lateral - Lumbar three-Lumbar four, left;  Surgeon: Gillie Duncans, MD;  Location: [facility] OR;  Service: Neurosurgery;  Laterality: Left;   TUBAL LIGATION     Patient Active Problem List   Diagnosis Date Noted   Gastritis 03/13/2012   Epigastric pain 01/23/2012   REFERRING PROVIDER: Duncans Gillie MD  REFERRING DIAG: Lumbar stenosis with neurogenic claudication.  Rationale for Evaluation and Treatment: Rehabilitation  THERAPY DIAG:  Other low back pain  Abnormal posture  Muscle spasm of back  ONSET DATE: Ongoing, surgery (06/29/23).    SUBJECTIVE:                                                                                                                                                                                           SUBJECTIVE STATEMENT: Last session helped. Sleeping better.    PERTINENT HISTORY:  Lumbar laminectomy/decompression microdiskectomy.    PAIN:  Are you having pain? Yes: NPRS  scale: 3/10. Pain location: LB, right > left.   Pain description: low back pain that is across her low back and radiates into her right buttock region; ache, throbbing and shooting.   Aggravating factors: standing greater than 10 minutes  Relieving factors: Lying down and uses ice and medications help decrease her pain    PRECAUTIONS: Fall.  Highly recommended she use a cane for additional safety.  She has access to a cane.    RED FLAGS: None   WEIGHT BEARING RESTRICTIONS: No  FALLS:  Has patient fallen in last 6 months? Yes. Number of falls 3.  LIVING ENVIRONMENT: Lives with: lives with their family Lives in: House/apartment Stairs: I external step.   Has following equipment at home: None  PLOF: Independent with basic ADLs  PATIENT GOALS: Decrease pain and do more.    OBJECTIVE:   PATIENT SURVEYS:  ODI:  40/50.   POSTURE: rounded shoulders, forward head, and flexed trunk .  She stands in 15 degrees of trunk flexion.    PALPATION: Patient c/o pain across her low back region.  She c/o significant tenderness over her right lumbar musculature which exhibits a great deal of tone.    LUMBAR ROM:  Active lumbar flexion limited by 50% and extension limited to 0 degrees.  LOWER EXTREMITY ROM:   WFL.  LOWER EXTREMITY MMT:   Normal bilateral LE strength.    GAIT: The patient's gait is antalgic and she walks slowly and purposefully in a flexed trunk posture.    TREATMENT DATE:  03/10/24;  Nustep level 3 x 15 minutes f/b patient in left sdly position while receiving STW/M x 8 minutes including ischemic release technique to right lumbar musculature f/b HMP and IFC at 80-150 Hz on 40% scan x 20 minutes.    03/05/24                                EXERCISE LOG  Exercise Repetitions and Resistance Comments  Nustep Lvl 3 x 16 mins   Ball Rollout  4.5 mins   [employer] 4.5 mins        Blank cell = exercise not performed today   Manual Therapy Soft Tissue Mobilization:  Right low back, STW/M to right lumbar paraspinals and right glute to decrease pain and tone   Modalities  Date:  Unattended Estim: Lumbar, IFC 80-150 hz, 15 mins, Pain and Tone   02/25/24 Nustep L5 x 6 min UEs/LEs                                                                                                                        Sidelying knee to chest stretch x 30 Sidelying hip flexion x10 Sidelying clamshell 2x10 Sidelying reverse clamshell 2x10 Supine piriformis stretch x30 Supine LTR x10 Supine PPT + TA activation + diaphragmatic breaths x10 Manual therapy: STM & TPR R sacral paraspinals and glute with ice Sitting hip hinge with pball x10 Standing lumbar ext against counter x10  PATIENT EDUCATION:  Education details: Discussed using a cane for addition safety. Person educated: Patient Education method: Medical illustrator Education comprehension: verbalized understanding  HOME EXERCISE PROGRAM: Access Code: [email or web address] URL: [email or web address] Date: 02/25/2024 Prepared by: Gellen April Earnie Starring  Exercises - Supine Lower Trunk Rotation  - 1 x daily - 7 x weekly - 1 sets - 10 reps - Supine Piriformis Stretch with Foot on Ground  - 1 x daily - 7 x weekly - 2 sets - 30 sec hold - Supine Bridge  - 1 x daily -  7 x weekly - 2 sets - 10 reps - Supine Posterior Pelvic Tilt  - 1 x daily - 7 x weekly - 2 sets - 10 reps - 3 sec hold - Clamshell  - 1 x daily - 7 x weekly - 2 sets - 10 reps  ASSESSMENT:  CLINICAL IMPRESSION: Patient felt much better after last treatment and finds soft tissue work effective.   OBJECTIVE IMPAIRMENTS: Abnormal gait, decreased activity tolerance, decreased ROM, increased muscle spasms, postural dysfunction, and pain.   ACTIVITY LIMITATIONS: carrying, lifting, bending, sitting, standing, and locomotion level  PARTICIPATION LIMITATIONS: meal prep, cleaning, laundry, shopping, community activity, and yard work  PERSONAL  FACTORS: Time since onset of injury/illness/exacerbation and 1 comorbidity: prior lumbar surgery are also affecting patient's functional outcome.   REHAB POTENTIAL: Fair plus/good-  CLINICAL DECISION MAKING: Evolving/moderate complexity  EVALUATION COMPLEXITY: Low   GOALS:  SHORT TERM GOALS: Target date: 03/05/24.  Ind with an HEP. Goal status: INITIAL   LONG TERM GOALS: Target date: 04/02/24  Stand 20 minutes with pain not > 4/10.  Goal status: INITIAL  2.  Walk in upright posture with normal gait cycle with a straight cane.  Goal status: INITIAL  3.  Perform ADL's with pain not  4/10.  Goal status: INITIAL  4.  Improve ODI score by at least 10 points.  Goal status: INITIAL   PLAN:  PT FREQUENCY: 2x/week  PT DURATION: 6 weeks  PLANNED INTERVENTIONS: 97110-Therapeutic exercises, 97530- Therapeutic activity, [id number]- Neuromuscular re-education, 97535- Self Care, 02859- Manual therapy, G0283- Electrical stimulation (unattended), 97035- Ultrasound, 79439 (1-2 muscles), 20561 (3+ muscles)- Dry Needling, Patient/Family education, Cryotherapy, and Moist heat.  PLAN FOR NEXT SESSION: Nustep, SKTC, core exercise progression, STW/M and modalities as needed.     Zavion Sleight, [place], PT 03/10/2024, 4:21 PM

## 2024-03-12 ENCOUNTER — Encounter: Admitting: Physical Therapy

## 2024-03-13 ENCOUNTER — Ambulatory Visit

## 2024-03-13 DIAGNOSIS — M5459 Other low back pain: Secondary | ICD-10-CM | POA: Diagnosis not present

## 2024-03-13 DIAGNOSIS — R293 Abnormal posture: Secondary | ICD-10-CM

## 2024-03-13 DIAGNOSIS — M62830 Muscle spasm of back: Secondary | ICD-10-CM

## 2024-03-13 NOTE — Therapy (Signed)
 OUTPATIENT PHYSICAL THERAPY THORACOLUMBAR TREATMENT   Patient Name: Loretta [name] MRN: [id number] DOB:1971/03/14, 53 y.o., female Today's Date: 03/13/2024  END OF SESSION:  PT End of Session - 03/13/24 0849     Visit Number 6    Number of Visits 12    Date for Recertification  04/02/24    PT Start Time 0845    PT Stop Time 0935    PT Time Calculation (min) 50 min    Activity Tolerance Patient tolerated treatment well    Behavior During Therapy [facility] for tasks assessed/performed           Past Medical History:  Diagnosis Date   Anxiety    Arthritis    Depression    Past Surgical History:  Procedure Laterality Date   ESOPHAGOGASTRODUODENOSCOPY  02/12/2012   Abnormal gastric mucosa with submucosal petechiae and antral erosions as described above-status post biopsy. Small hiatal hernia. Bx showed reactive changes with focal erosion and slight inflammation but no H.pylori.    LUMBAR LAMINECTOMY/DECOMPRESSION MICRODISCECTOMY Left 06/29/2023   Procedure: Microdiscectomy far lateral - Lumbar three-Lumbar four, left;  Surgeon: Gillie Duncans, MD;  Location: [facility] OR;  Service: Neurosurgery;  Laterality: Left;   TUBAL LIGATION     Patient Active Problem List   Diagnosis Date Noted   Gastritis 03/13/2012   Epigastric pain 01/23/2012   REFERRING PROVIDER: Duncans Gillie MD  REFERRING DIAG: Lumbar stenosis with neurogenic claudication.  Rationale for Evaluation and Treatment: Rehabilitation  THERAPY DIAG:  Other low back pain  Abnormal posture  Muscle spasm of back  ONSET DATE: Ongoing, surgery (06/29/23).    SUBJECTIVE:                                                                                                                                                                                           SUBJECTIVE STATEMENT: Last session helped. Sleeping better.    PERTINENT HISTORY:  Lumbar laminectomy/decompression microdiskectomy.    PAIN:  Are you having pain? Yes: NPRS  scale: 3/10. Pain location: LB, right > left.   Pain description: low back pain that is across her low back and radiates into her right buttock region; ache, throbbing and shooting.   Aggravating factors: standing greater than 10 minutes  Relieving factors: Lying down and uses ice and medications help decrease her pain    PRECAUTIONS: Fall.  Highly recommended she use a cane for additional safety.  She has access to a cane.    RED FLAGS: None   WEIGHT BEARING RESTRICTIONS: No  FALLS:  Has patient fallen in last 6 months? Yes. Number of falls 3.  LIVING ENVIRONMENT: Lives with: lives with their family Lives in: House/apartment Stairs: I external step.   Has following equipment at home: None  PLOF: Independent with basic ADLs  PATIENT GOALS: Decrease pain and do more.    OBJECTIVE:   PATIENT SURVEYS:  ODI:  40/50.   POSTURE: rounded shoulders, forward head, and flexed trunk .  She stands in 15 degrees of trunk flexion.    PALPATION: Patient c/o pain across her low back region.  She c/o significant tenderness over her right lumbar musculature which exhibits a great deal of tone.    LUMBAR ROM:  Active lumbar flexion limited by 50% and extension limited to 0 degrees.  LOWER EXTREMITY ROM:   WFL.  LOWER EXTREMITY MMT:   Normal bilateral LE strength.    GAIT: The patient's gait is antalgic and she walks slowly and purposefully in a flexed trunk posture.    TREATMENT DATE:  03/13/24                                EXERCISE LOG  Exercise Repetitions and Resistance Comments  Nustep Lvl 3 x 16 mins   [employer]    Goal Assessment See Below    Blank cell = exercise not performed today   Manual Therapy Soft Tissue Mobilization: Central low back, STW/M to bilateral lumbar paraspinals and right glute to decrease pain and tone   Modalities  Date:  Unattended Estim: Lumbar, IFC 80-150 hz, 15 mins, Pain and Tone   03/10/24;  Nustep level 3 x 15  minutes f/b patient in left sdly position while receiving STW/M x 8 minutes including ischemic release technique to right lumbar musculature f/b HMP and IFC at 80-150 Hz on 40% scan x 20 minutes.    03/05/24                                EXERCISE LOG  Exercise Repetitions and Resistance Comments  Nustep Lvl 3 x 16 mins   Ball Rollout  4.5 mins   [employer] 4.5 mins        Blank cell = exercise not performed today   Manual Therapy Soft Tissue Mobilization: Right low back, STW/M to right lumbar paraspinals and right glute to decrease pain and tone   Modalities  Date:  Unattended Estim: Lumbar, IFC 80-150 hz, 15 mins, Pain and Tone    PATIENT EDUCATION:  Education details: Discussed using a cane for addition safety. Person educated: Patient Education method: Medical illustrator Education comprehension: verbalized understanding  HOME EXERCISE PROGRAM: Access Code: [email or web address] URL: [email or web address] Date: 02/25/2024 Prepared by: Gellen April Earnie Starring  Exercises - Supine Lower Trunk Rotation  - 1 x daily - 7 x weekly - 1 sets - 10 reps - Supine Piriformis Stretch with Foot on Ground  - 1 x daily - 7 x weekly - 2 sets - 30 sec hold - Supine Bridge  - 1 x daily - 7 x weekly - 2 sets - 10 reps - Supine Posterior Pelvic Tilt  - 1 x daily - 7 x weekly - 2 sets - 10 reps - 3 sec hold - Clamshell  - 1 x daily - 7 x weekly - 2 sets - 10 reps  ASSESSMENT:  CLINICAL IMPRESSION: Patient arrives for today's treatment session reporting 3/10 centralized low back pain.  Pt able to decrease ODI score to 17/50, well surpassing her LTG.  Pt continues to ambulate without use of AD at this point in time.  STW/M performed to bil lumbar paraspinals to decrease pain and tone.  Normal responses to estim and MH noted upon removal.  Pt reported 1/10 low back pain at completion of today's treatment session.   OBJECTIVE IMPAIRMENTS: Abnormal gait, decreased activity tolerance,  decreased ROM, increased muscle spasms, postural dysfunction, and pain.   ACTIVITY LIMITATIONS: carrying, lifting, bending, sitting, standing, and locomotion level  PARTICIPATION LIMITATIONS: meal prep, cleaning, laundry, shopping, community activity, and yard work  PERSONAL FACTORS: Time since onset of injury/illness/exacerbation and 1 comorbidity: prior lumbar surgery are also affecting patient's functional outcome.   REHAB POTENTIAL: Fair plus/good-  CLINICAL DECISION MAKING: Evolving/moderate complexity  EVALUATION COMPLEXITY: Low   GOALS:  SHORT TERM GOALS: Target date: 03/05/24.  Ind with an HEP. Goal status: MET   LONG TERM GOALS: Target date: 04/02/24  Stand 20 minutes with pain not > 4/10.  10/9: 15 mins Goal status: IN PROGRESS  2.  Walk in upright posture with normal gait cycle with a straight cane.   10/9: ambulating without AD Goal status: IN PROGRESS  3.  Perform ADL's with pain not  4/10.   10/9: 5/10 Goal status: IN PROGRESS  4.  Improve ODI score by at least 10 points. 10/9:  17/50 Goal status: MET   PLAN:  PT FREQUENCY: 2x/week  PT DURATION: 6 weeks  PLANNED INTERVENTIONS: 97110-Therapeutic exercises, 97530- Therapeutic activity, 97112- Neuromuscular re-education, 97535- Self Care, 02859- Manual therapy, G0283- Electrical stimulation (unattended), 97035- Ultrasound, 79439 (1-2 muscles), 20561 (3+ muscles)- Dry Needling, Patient/Family education, Cryotherapy, and Moist heat.  PLAN FOR NEXT SESSION: Nustep, SKTC, core exercise progression, STW/M and modalities as needed.     Delon DELENA Gosling, PTA 03/13/2024, 9:35 AM

## 2024-03-19 ENCOUNTER — Ambulatory Visit

## 2024-03-19 DIAGNOSIS — M5459 Other low back pain: Secondary | ICD-10-CM

## 2024-03-19 DIAGNOSIS — R293 Abnormal posture: Secondary | ICD-10-CM

## 2024-03-19 DIAGNOSIS — M62830 Muscle spasm of back: Secondary | ICD-10-CM

## 2024-03-19 NOTE — Therapy (Signed)
 OUTPATIENT PHYSICAL THERAPY THORACOLUMBAR TREATMENT   Patient Name: Loretta [name] MRN: [id number] DOB:1970-11-16, 53 y.o., female Today's Date: 03/19/2024  END OF SESSION:  PT End of Session - 03/19/24 1021     Visit Number 7    Number of Visits 12    Date for Recertification  04/02/24    PT Start Time 1015    PT Stop Time 1113    PT Time Calculation (min) 58 min    Activity Tolerance Patient tolerated treatment well    Behavior During Therapy [facility] for tasks assessed/performed           Past Medical History:  Diagnosis Date   Anxiety    Arthritis    Depression    Past Surgical History:  Procedure Laterality Date   ESOPHAGOGASTRODUODENOSCOPY  02/12/2012   Abnormal gastric mucosa with submucosal petechiae and antral erosions as described above-status post biopsy. Small hiatal hernia. Bx showed reactive changes with focal erosion and slight inflammation but no H.pylori.    LUMBAR LAMINECTOMY/DECOMPRESSION MICRODISCECTOMY Left 06/29/2023   Procedure: Microdiscectomy far lateral - Lumbar three-Lumbar four, left;  Surgeon: Gillie Duncans, MD;  Location: [facility] OR;  Service: Neurosurgery;  Laterality: Left;   TUBAL LIGATION     Patient Active Problem List   Diagnosis Date Noted   Gastritis 03/13/2012   Epigastric pain 01/23/2012   REFERRING PROVIDER: Duncans Gillie MD  REFERRING DIAG: Lumbar stenosis with neurogenic claudication.  Rationale for Evaluation and Treatment: Rehabilitation  THERAPY DIAG:  Other low back pain  Abnormal posture  Muscle spasm of back  ONSET DATE: Ongoing, surgery (06/29/23).    SUBJECTIVE:                                                                                                                                                                                           SUBJECTIVE STATEMENT: Pt reports 3/10 low back pain today   PERTINENT HISTORY:  Lumbar laminectomy/decompression microdiskectomy.    PAIN:  Are you having pain? Yes: NPRS  scale: 3/10. Pain location: LB, central   Pain description: low back pain that is across her low back and radiates into her right buttock region; ache, throbbing and shooting.   Aggravating factors: standing greater than 10 minutes  Relieving factors: Lying down and uses ice and medications help decrease her pain    PRECAUTIONS: Fall.  Highly recommended she use a cane for additional safety.  She has access to a cane.    RED FLAGS: None   WEIGHT BEARING RESTRICTIONS: No  FALLS:  Has patient fallen in last 6 months? Yes. Number of falls 3.    LIVING  ENVIRONMENT: Lives with: lives with their family Lives in: House/apartment Stairs: I external step.   Has following equipment at home: None  PLOF: Independent with basic ADLs  PATIENT GOALS: Decrease pain and do more.    OBJECTIVE:   PATIENT SURVEYS:  ODI:  40/50.   POSTURE: rounded shoulders, forward head, and flexed trunk .  She stands in 15 degrees of trunk flexion.    PALPATION: Patient c/o pain across her low back region.  She c/o significant tenderness over her right lumbar musculature which exhibits a great deal of tone.    LUMBAR ROM:  Active lumbar flexion limited by 50% and extension limited to 0 degrees.  LOWER EXTREMITY ROM:   WFL.  LOWER EXTREMITY MMT:   Normal bilateral LE strength.    GAIT: The patient's gait is antalgic and she walks slowly and purposefully in a flexed trunk posture.    TREATMENT DATE:  03/19/24                                EXERCISE LOG  Exercise Repetitions and Resistance Comments  Nustep Lvl 3 x 16 mins   XTS Rows Blue 2 sets of 10 reps   XTS Pulldowns Blue 2 sets of 10 reps   Goal Assessment     Blank cell = exercise not performed today   Manual Therapy Soft Tissue Mobilization: Central low back, STW/M to bilateral lumbar paraspinals and right glute to decrease pain and tone   Modalities  Date:  Unattended Estim: Lumbar, IFC 80-150 hz, 15 mins, Pain and Tone    03/10/24;  Nustep level 3 x 15 minutes f/b patient in left sdly position while receiving STW/M x 8 minutes including ischemic release technique to right lumbar musculature f/b HMP and IFC at 80-150 Hz on 40% scan x 20 minutes.    03/05/24                                EXERCISE LOG  Exercise Repetitions and Resistance Comments  Nustep Lvl 3 x 16 mins   Ball Rollout  4.5 mins   [employer] 4.5 mins        Blank cell = exercise not performed today   Manual Therapy Soft Tissue Mobilization: Right low back, STW/M to right lumbar paraspinals and right glute to decrease pain and tone   Modalities  Date:  Unattended Estim: Lumbar, IFC 80-150 hz, 15 mins, Pain and Tone    PATIENT EDUCATION:  Education details: Discussed using a cane for addition safety. Person educated: Patient Education method: Medical illustrator Education comprehension: verbalized understanding  HOME EXERCISE PROGRAM: Access Code: [email or web address] URL: [email or web address] Date: 02/25/2024 Prepared by: Gellen April Earnie Starring  Exercises - Supine Lower Trunk Rotation  - 1 x daily - 7 x weekly - 1 sets - 10 reps - Supine Piriformis Stretch with Foot on Ground  - 1 x daily - 7 x weekly - 2 sets - 30 sec hold - Supine Bridge  - 1 x daily - 7 x weekly - 2 sets - 10 reps - Supine Posterior Pelvic Tilt  - 1 x daily - 7 x weekly - 2 sets - 10 reps - 3 sec hold - Clamshell  - 1 x daily - 7 x weekly - 2 sets - 10 reps  ASSESSMENT:  CLINICAL IMPRESSION: Patient arrives for today's treatment  session reporting 3/10 centralized low back pain. Pt introduced to standing XTS resisted exercises today with pt requiring min cues for proper technique and posture.  STW/M performed to bil lumbar parapsinals to decrease pain and tone.  Normal responses to estim and MH noted upon removal.  Pt reported decreased pain at completion of today's treatment session.   OBJECTIVE IMPAIRMENTS: Abnormal gait, decreased  activity tolerance, decreased ROM, increased muscle spasms, postural dysfunction, and pain.   ACTIVITY LIMITATIONS: carrying, lifting, bending, sitting, standing, and locomotion level  PARTICIPATION LIMITATIONS: meal prep, cleaning, laundry, shopping, community activity, and yard work  PERSONAL FACTORS: Time since onset of injury/illness/exacerbation and 1 comorbidity: prior lumbar surgery are also affecting patient's functional outcome.   REHAB POTENTIAL: Fair plus/good-  CLINICAL DECISION MAKING: Evolving/moderate complexity  EVALUATION COMPLEXITY: Low   GOALS:  SHORT TERM GOALS: Target date: 03/05/24.  Ind with an HEP. Goal status: MET   LONG TERM GOALS: Target date: 04/02/24  Stand 20 minutes with pain not > 4/10.  10/9: 15 mins Goal status: IN PROGRESS  2.  Walk in upright posture with normal gait cycle with a straight cane.   10/9: ambulating without AD Goal status: IN PROGRESS  3.  Perform ADL's with pain not  4/10.   10/9: 5/10 Goal status: IN PROGRESS  4.  Improve ODI score by at least 10 points. 10/9:  17/50 Goal status: MET   PLAN:  PT FREQUENCY: 2x/week  PT DURATION: 6 weeks  PLANNED INTERVENTIONS: 97110-Therapeutic exercises, 97530- Therapeutic activity, 97112- Neuromuscular re-education, 97535- Self Care, 02859- Manual therapy, G0283- Electrical stimulation (unattended), 97035- Ultrasound, 79439 (1-2 muscles), 20561 (3+ muscles)- Dry Needling, Patient/Family education, Cryotherapy, and Moist heat.  PLAN FOR NEXT SESSION: Nustep, SKTC, core exercise progression, STW/M and modalities as needed.     Delon DELENA Gosling, PTA 03/19/2024, 11:15 AM

## 2024-03-26 ENCOUNTER — Ambulatory Visit

## 2024-03-27 ENCOUNTER — Ambulatory Visit: Admitting: Physical Therapy

## 2024-03-27 DIAGNOSIS — M5459 Other low back pain: Secondary | ICD-10-CM | POA: Diagnosis not present

## 2024-03-27 DIAGNOSIS — R293 Abnormal posture: Secondary | ICD-10-CM

## 2024-03-27 DIAGNOSIS — M62830 Muscle spasm of back: Secondary | ICD-10-CM

## 2024-03-27 NOTE — Therapy (Signed)
 OUTPATIENT PHYSICAL THERAPY THORACOLUMBAR TREATMENT   Patient Name: Loretta [name] MRN: [id number] DOB:1971/06/04, 53 y.o., female Today's Date: 03/27/2024  END OF SESSION:  PT End of Session - 03/27/24 1302     Visit Number 8    Number of Visits 12    Date for Recertification  04/02/24    PT Start Time 0100    PT Stop Time 0205    PT Time Calculation (min) 65 min    Activity Tolerance Patient tolerated treatment well    Behavior During Therapy [facility] for tasks assessed/performed           Past Medical History:  Diagnosis Date   Anxiety    Arthritis    Depression    Past Surgical History:  Procedure Laterality Date   ESOPHAGOGASTRODUODENOSCOPY  02/12/2012   Abnormal gastric mucosa with submucosal petechiae and antral erosions as described above-status post biopsy. Small hiatal hernia. Bx showed reactive changes with focal erosion and slight inflammation but no H.pylori.    LUMBAR LAMINECTOMY/DECOMPRESSION MICRODISCECTOMY Left 06/29/2023   Procedure: Microdiscectomy far lateral - Lumbar three-Lumbar four, left;  Surgeon: Gillie Duncans, MD;  Location: [facility] OR;  Service: Neurosurgery;  Laterality: Left;   TUBAL LIGATION     Patient Active Problem List   Diagnosis Date Noted   Gastritis 03/13/2012   Epigastric pain 01/23/2012   REFERRING PROVIDER: Duncans Gillie MD  REFERRING DIAG: Lumbar stenosis with neurogenic claudication.  Rationale for Evaluation and Treatment: Rehabilitation  THERAPY DIAG:  Other low back pain  Abnormal posture  Muscle spasm of back  ONSET DATE: Ongoing, surgery (06/29/23).    SUBJECTIVE:                                                                                                                                                                                           SUBJECTIVE STATEMENT: Pt reports 3/10 low back pain today   PERTINENT HISTORY:  Lumbar laminectomy/decompression microdiskectomy.    PAIN:  Are you having pain? Yes: NPRS  scale: 3/10. Pain location: LB, central   Pain description: low back pain that is across her low back and radiates into her right buttock region; ache, throbbing and shooting.   Aggravating factors: standing greater than 10 minutes  Relieving factors: Lying down and uses ice and medications help decrease her pain    PRECAUTIONS: Fall.  Highly recommended she use a cane for additional safety.  She has access to a cane.    RED FLAGS: None   WEIGHT BEARING RESTRICTIONS: No  FALLS:  Has patient fallen in last 6 months? Yes. Number of falls 3.    LIVING  ENVIRONMENT: Lives with: lives with their family Lives in: House/apartment Stairs: I external step.   Has following equipment at home: None  PLOF: Independent with basic ADLs  PATIENT GOALS: Decrease pain and do more.    OBJECTIVE:   PATIENT SURVEYS:  ODI:  40/50.   POSTURE: rounded shoulders, forward head, and flexed trunk .  She stands in 15 degrees of trunk flexion.    PALPATION: Patient c/o pain across her low back region.  She c/o significant tenderness over her right lumbar musculature which exhibits a great deal of tone.    LUMBAR ROM:  Active lumbar flexion limited by 50% and extension limited to 0 degrees.  LOWER EXTREMITY ROM:   WFL.  LOWER EXTREMITY MMT:   Normal bilateral LE strength.    GAIT: The patient's gait is antalgic and she walks slowly and purposefully in a flexed trunk posture.    TREATMENT DATE:  03/27/24:                                     EXERCISE LOG  Exercise Repetitions and Resistance Comments  Nustep Level 4 x 16 minutes   Back ext machine 40# with core activation x 3 minutes   Ab curl machine 40# with core activation x 3 minutes   Seated ball roll outs 3 minutes        f/b patient in left sdly position while receiving STW/M x 13 minutes including ischemic release technique to right lumbar musculature f/b HMP and IFC at 80-150 Hz on 40% scan x 20 minutes.    03/19/24                                 EXERCISE LOG  Exercise Repetitions and Resistance Comments  Nustep Lvl 3 x 16 mins   XTS Rows Blue 2 sets of 10 reps   XTS Pulldowns Blue 2 sets of 10 reps   Goal Assessment     Blank cell = exercise not performed today   Manual Therapy Soft Tissue Mobilization: Central low back, STW/M to bilateral lumbar paraspinals and right glute to decrease pain and tone   Modalities  Date:  Unattended Estim: Lumbar, IFC 80-150 hz, 15 mins, Pain and Tone      PATIENT EDUCATION:  Education details: Discussed using a cane for addition safety. Person educated: Patient Education method: Medical illustrator Education comprehension: verbalized understanding  HOME EXERCISE PROGRAM: Access Code: [email or web address] URL: [email or web address] Date: 02/25/2024 Prepared by: Gellen April Earnie Starring  Exercises - Supine Lower Trunk Rotation  - 1 x daily - 7 x weekly - 1 sets - 10 reps - Supine Piriformis Stretch with Foot on Ground  - 1 x daily - 7 x weekly - 2 sets - 30 sec hold - Supine Bridge  - 1 x daily - 7 x weekly - 2 sets - 10 reps - Supine Posterior Pelvic Tilt  - 1 x daily - 7 x weekly - 2 sets - 10 reps - 3 sec hold - Clamshell  - 1 x daily - 7 x weekly - 2 sets - 10 reps  ASSESSMENT:  CLINICAL IMPRESSION: Patient is very motivated and did very well with the addition of the back and ab machine performing without complaint and with very good technique.    OBJECTIVE IMPAIRMENTS: Abnormal gait, decreased activity tolerance, decreased  ROM, increased muscle spasms, postural dysfunction, and pain.   ACTIVITY LIMITATIONS: carrying, lifting, bending, sitting, standing, and locomotion level  PARTICIPATION LIMITATIONS: meal prep, cleaning, laundry, shopping, community activity, and yard work  PERSONAL FACTORS: Time since onset of injury/illness/exacerbation and 1 comorbidity: prior lumbar surgery are also affecting patient's functional outcome.   REHAB  POTENTIAL: Fair plus/good-  CLINICAL DECISION MAKING: Evolving/moderate complexity  EVALUATION COMPLEXITY: Low   GOALS:  SHORT TERM GOALS: Target date: 03/05/24.  Ind with an HEP. Goal status: MET   LONG TERM GOALS: Target date: 04/02/24  Stand 20 minutes with pain not > 4/10.  10/9: 15 mins Goal status: IN PROGRESS  2.  Walk in upright posture with normal gait cycle with a straight cane.   10/9: ambulating without AD Goal status: IN PROGRESS  3.  Perform ADL's with pain not  4/10.   10/9: 5/10 Goal status: IN PROGRESS  4.  Improve ODI score by at least 10 points. 10/9:  17/50 Goal status: MET   PLAN:  PT FREQUENCY: 2x/week  PT DURATION: 6 weeks  PLANNED INTERVENTIONS: 97110-Therapeutic exercises, 97530- Therapeutic activity, 97112- Neuromuscular re-education, 97535- Self Care, 02859- Manual therapy, G0283- Electrical stimulation (unattended), 97035- Ultrasound, 79439 (1-2 muscles), 20561 (3+ muscles)- Dry Needling, Patient/Family education, Cryotherapy, and Moist heat.  PLAN FOR NEXT SESSION: Nustep, SKTC, core exercise progression, STW/M and modalities as needed.     Kayin Kettering, [place], PT 03/27/2024, 2:15 PM

## 2024-03-31 ENCOUNTER — Ambulatory Visit

## 2024-03-31 DIAGNOSIS — M5459 Other low back pain: Secondary | ICD-10-CM

## 2024-03-31 DIAGNOSIS — R293 Abnormal posture: Secondary | ICD-10-CM

## 2024-03-31 DIAGNOSIS — M62830 Muscle spasm of back: Secondary | ICD-10-CM

## 2024-03-31 NOTE — Therapy (Signed)
 OUTPATIENT PHYSICAL THERAPY THORACOLUMBAR TREATMENT   Patient Name: Loretta [name] MRN: [id number] DOB:[birth year], 53 y.o., female Today's Date: 03/31/2024  END OF SESSION:  PT End of Session - 03/31/24 1402     Visit Number 9    Number of Visits 12    Date for Recertification  04/02/24    PT Start Time 1345    PT Stop Time 1444    PT Time Calculation (min) 59 min    Activity Tolerance Patient tolerated treatment well    Behavior During Therapy [facility] for tasks assessed/performed           Past Medical History:  Diagnosis Date   Anxiety    Arthritis    Depression    Past Surgical History:  Procedure Laterality Date   ESOPHAGOGASTRODUODENOSCOPY  02/12/2012   Abnormal gastric mucosa with submucosal petechiae and antral erosions as described above-status post biopsy. Small hiatal hernia. Bx showed reactive changes with focal erosion and slight inflammation but no H.pylori.    LUMBAR LAMINECTOMY/DECOMPRESSION MICRODISCECTOMY Left 06/29/2023   Procedure: Microdiscectomy far lateral - Lumbar three-Lumbar four, left;  Surgeon: Gillie Duncans, MD;  Location: [facility] OR;  Service: Neurosurgery;  Laterality: Left;   TUBAL LIGATION     Patient Active Problem List   Diagnosis Date Noted   Gastritis 03/13/2012   Epigastric pain 01/23/2012   REFERRING PROVIDER: Duncans Gillie MD  REFERRING DIAG: Lumbar stenosis with neurogenic claudication.  Rationale for Evaluation and Treatment: Rehabilitation  THERAPY DIAG:  Other low back pain  Abnormal posture  Muscle spasm of back  ONSET DATE: Ongoing, surgery (06/29/23).    SUBJECTIVE:                                                                                                                                                                                           SUBJECTIVE STATEMENT: Pt reports 4/10 low back pain today   PERTINENT HISTORY:  Lumbar laminectomy/decompression microdiskectomy.    PAIN:  Are you having pain? Yes: NPRS  scale: 4/10. Pain location: LB, central   Pain description: low back pain that is across her low back and radiates into her right buttock region; ache, throbbing and shooting.   Aggravating factors: standing greater than 10 minutes  Relieving factors: Lying down and uses ice and medications help decrease her pain    PRECAUTIONS: Fall.  Highly recommended she use a cane for additional safety.  She has access to a cane.    RED FLAGS: None   WEIGHT BEARING RESTRICTIONS: No  FALLS:  Has patient fallen in last 6 months? Yes. Number of falls 3.    LIVING  ENVIRONMENT: Lives with: lives with their family Lives in: House/apartment Stairs: I external step.   Has following equipment at home: None  PLOF: Independent with basic ADLs  PATIENT GOALS: Decrease pain and do more.    OBJECTIVE:   PATIENT SURVEYS:  ODI:  40/50.   POSTURE: rounded shoulders, forward head, and flexed trunk .  She stands in 15 degrees of trunk flexion.    PALPATION: Patient c/o pain across her low back region.  She c/o significant tenderness over her right lumbar musculature which exhibits a great deal of tone.    LUMBAR ROM:  Active lumbar flexion limited by 50% and extension limited to 0 degrees.  LOWER EXTREMITY ROM:   WFL.  LOWER EXTREMITY MMT:   Normal bilateral LE strength.    GAIT: The patient's gait is antalgic and she walks slowly and purposefully in a flexed trunk posture.    TREATMENT DATE:  03/31/24:                                   EXERCISE LOG  Exercise Repetitions and Resistance Comments  Nustep Level 4 x 16 minutes   Back ext machine 40# with core activation x 3.5 minutes   Ab curl machine 40# with core activation x 3.5 minutes   Seated ball roll outs         f/b patient in left sdly position while receiving STW/M to right lumbar musculature f/b HMP and IFC at 80-150 Hz on 40% scan x 15 minutes.    03/19/24                                EXERCISE LOG  Exercise Repetitions  and Resistance Comments  Nustep Lvl 3 x 16 mins   XTS Rows Blue 2 sets of 10 reps   XTS Pulldowns Blue 2 sets of 10 reps   Goal Assessment     Blank cell = exercise not performed today   Manual Therapy Soft Tissue Mobilization: Central low back, STW/M to bilateral lumbar paraspinals and right glute to decrease pain and tone   Modalities  Date:  Unattended Estim: Lumbar, IFC 80-150 hz, 15 mins, Pain and Tone      PATIENT EDUCATION:  Education details: Discussed using a cane for addition safety. Person educated: Patient Education method: Medical Illustrator Education comprehension: verbalized understanding  HOME EXERCISE PROGRAM: Access Code: [email or web address] URL: [email or web address] Date: 02/25/2024 Prepared by: Gellen April Earnie Starring  Exercises - Supine Lower Trunk Rotation  - 1 x daily - 7 x weekly - 1 sets - 10 reps - Supine Piriformis Stretch with Foot on Ground  - 1 x daily - 7 x weekly - 2 sets - 30 sec hold - Supine Bridge  - 1 x daily - 7 x weekly - 2 sets - 10 reps - Supine Posterior Pelvic Tilt  - 1 x daily - 7 x weekly - 2 sets - 10 reps - 3 sec hold - Clamshell  - 1 x daily - 7 x weekly - 2 sets - 10 reps  ASSESSMENT:  CLINICAL IMPRESSION: Pt arrives for today's treatment session 4/10 low back pain today.  Pt attributes increased pain to cold, rainy weather.  Pt able to tolerate increased time on lumbar cybex machinery today without issue.  STW/M performed to right lumbar musculature to decrease pain and tone.  Normal responses to estim and MH noted upon removal.  Pt will be ready for discharge at next treatment session.  Pt reported 2/10 low back pain at completion of today's treatment session.    OBJECTIVE IMPAIRMENTS: Abnormal gait, decreased activity tolerance, decreased ROM, increased muscle spasms, postural dysfunction, and pain.   ACTIVITY LIMITATIONS: carrying, lifting, bending, sitting, standing, and locomotion  level  PARTICIPATION LIMITATIONS: meal prep, cleaning, laundry, shopping, community activity, and yard work  PERSONAL FACTORS: Time since onset of injury/illness/exacerbation and 1 comorbidity: prior lumbar surgery are also affecting patient's functional outcome.   REHAB POTENTIAL: Fair plus/good-  CLINICAL DECISION MAKING: Evolving/moderate complexity  EVALUATION COMPLEXITY: Low   GOALS:  SHORT TERM GOALS: Target date: 03/05/24.  Ind with an HEP. Goal status: MET   LONG TERM GOALS: Target date: 04/02/24  Stand 20 minutes with pain not > 4/10.  10/9: 15 mins Goal status: IN PROGRESS  2.  Walk in upright posture with normal gait cycle with a straight cane.   10/9: ambulating without AD Goal status: IN PROGRESS  3.  Perform ADL's with pain not  4/10.   10/9: 5/10 Goal status: IN PROGRESS  4.  Improve ODI score by at least 10 points. 10/9:  17/50 Goal status: MET   PLAN:  PT FREQUENCY: 2x/week  PT DURATION: 6 weeks  PLANNED INTERVENTIONS: 97110-Therapeutic exercises, 97530- Therapeutic activity, 97112- Neuromuscular re-education, 97535- Self Care, 02859- Manual therapy, G0283- Electrical stimulation (unattended), 97035- Ultrasound, 79439 (1-2 muscles), 20561 (3+ muscles)- Dry Needling, Patient/Family education, Cryotherapy, and Moist heat.  PLAN FOR NEXT SESSION: Nustep, SKTC, core exercise progression, STW/M and modalities as needed.     Delon DELENA Gosling, PTA 03/31/2024, 2:53 PM

## 2024-04-03 ENCOUNTER — Ambulatory Visit

## 2024-04-03 DIAGNOSIS — M62830 Muscle spasm of back: Secondary | ICD-10-CM

## 2024-04-03 DIAGNOSIS — R293 Abnormal posture: Secondary | ICD-10-CM

## 2024-04-03 DIAGNOSIS — M5459 Other low back pain: Secondary | ICD-10-CM | POA: Diagnosis not present

## 2024-04-03 NOTE — Therapy (Addendum)
 OUTPATIENT PHYSICAL THERAPY THORACOLUMBAR TREATMENT   Patient Name: Loretta [name] MRN: [id number] DOB:[birth year], 53 y.o., female Today's Date: 04/03/2024  END OF SESSION:  PT End of Session - 04/03/24 1348     Visit Number 10    Number of Visits 12    Date for Recertification  04/02/24    PT Start Time 1345    PT Stop Time 1443    PT Time Calculation (min) 58 min    Activity Tolerance Patient tolerated treatment well    Behavior During Therapy [facility] for tasks assessed/performed           Past Medical History:  Diagnosis Date   Anxiety    Arthritis    Depression    Past Surgical History:  Procedure Laterality Date   ESOPHAGOGASTRODUODENOSCOPY  02/12/2012   Abnormal gastric mucosa with submucosal petechiae and antral erosions as described above-status post biopsy. Small hiatal hernia. Bx showed reactive changes with focal erosion and slight inflammation but no H.pylori.    LUMBAR LAMINECTOMY/DECOMPRESSION MICRODISCECTOMY Left 06/29/2023   Procedure: Microdiscectomy far lateral - Lumbar three-Lumbar four, left;  Surgeon: Gillie Duncans, MD;  Location: [facility] OR;  Service: Neurosurgery;  Laterality: Left;   TUBAL LIGATION     Patient Active Problem List   Diagnosis Date Noted   Gastritis 03/13/2012   Epigastric pain 01/23/2012   REFERRING PROVIDER: Duncans Gillie MD  REFERRING DIAG: Lumbar stenosis with neurogenic claudication.  Rationale for Evaluation and Treatment: Rehabilitation  THERAPY DIAG:  Other low back pain  Abnormal posture  Muscle spasm of back  ONSET DATE: Ongoing, surgery (06/29/23).    SUBJECTIVE:                                                                                                                                                                                           SUBJECTIVE STATEMENT: Pt reports 4/10 low back pain today.  Pt ready for discharge today.    PERTINENT HISTORY:  Lumbar laminectomy/decompression microdiskectomy.    PAIN:   Are you having pain? Yes: NPRS scale: 4/10. Pain location: LB, central   Pain description: low back pain that is across her low back and radiates into her right buttock region; ache, throbbing and shooting.   Aggravating factors: standing greater than 10 minutes  Relieving factors: Lying down and uses ice and medications help decrease her pain    PRECAUTIONS: Fall.  Highly recommended she use a cane for additional safety.  She has access to a cane.    RED FLAGS: None   WEIGHT BEARING RESTRICTIONS: No  FALLS:  Has patient fallen in last 6 months? Yes. Number  of falls 3.    LIVING ENVIRONMENT: Lives with: lives with their family Lives in: House/apartment Stairs: I external step.   Has following equipment at home: None  PLOF: Independent with basic ADLs  PATIENT GOALS: Decrease pain and do more.    OBJECTIVE:   PATIENT SURVEYS:  ODI:  40/50.   POSTURE: rounded shoulders, forward head, and flexed trunk .  She stands in 15 degrees of trunk flexion.    PALPATION: Patient c/o pain across her low back region.  She c/o significant tenderness over her right lumbar musculature which exhibits a great deal of tone.    LUMBAR ROM:  Active lumbar flexion limited by 50% and extension limited to 0 degrees.  LOWER EXTREMITY ROM:   WFL.  LOWER EXTREMITY MMT:   Normal bilateral LE strength.    GAIT: The patient's gait is antalgic and she walks slowly and purposefully in a flexed trunk posture.    TREATMENT DATE:  04/03/24:                                   EXERCISE LOG  Exercise Repetitions and Resistance Comments  Nustep Level 4 x 16 minutes   Back ext machine 40# with core activation x 4 minutes   Ab curl machine 40# with core activation x 4 minutes   Seated ball roll outs         f/b patient in left sdly position while receiving STW/M to right lumbar musculature f/b HMP and IFC at 80-150 Hz on 40% scan x 15 minutes.    03/19/24                                EXERCISE  LOG  Exercise Repetitions and Resistance Comments  Nustep Lvl 3 x 16 mins   XTS Rows Blue 2 sets of 10 reps   XTS Pulldowns Blue 2 sets of 10 reps   Goal Assessment     Blank cell = exercise not performed today   Manual Therapy Soft Tissue Mobilization: Central low back, STW/M to bilateral lumbar paraspinals and right glute to decrease pain and tone   Modalities  Date:  Unattended Estim: Lumbar, IFC 80-150 hz, 15 mins, Pain and Tone      PATIENT EDUCATION:  Education details: Discussed using a cane for addition safety. Person educated: Patient Education method: Medical Illustrator Education comprehension: verbalized understanding  HOME EXERCISE PROGRAM: Access Code: [email or web address] URL: [email or web address] Date: 02/25/2024 Prepared by: Gellen April Earnie Starring  Exercises - Supine Lower Trunk Rotation  - 1 x daily - 7 x weekly - 1 sets - 10 reps - Supine Piriformis Stretch with Foot on Ground  - 1 x daily - 7 x weekly - 2 sets - 30 sec hold - Supine Bridge  - 1 x daily - 7 x weekly - 2 sets - 10 reps - Supine Posterior Pelvic Tilt  - 1 x daily - 7 x weekly - 2 sets - 10 reps - 3 sec hold - Clamshell  - 1 x daily - 7 x weekly - 2 sets - 10 reps  ASSESSMENT:  CLINICAL IMPRESSION: Pt arrives for today's treatment session 4/10 low back pain today.  Pt able to tolerate increased time on the lumbar cybex exercises today with very minimal fatigue noted.  Pt reports that she can tolerate standing ~10 to  15 mins before requiring a seated rest break due to pain.  Pt also reports 5/10 low back pain with performance of ADLs.  Pt encouraged to call the facility with any questions or concerns.  Normal responses to estim and MH noted upon removal.  Pt reported decreased pain at completion of today's treatment session.  Pt ready for discharge at this time.   OBJECTIVE IMPAIRMENTS: Abnormal gait, decreased activity tolerance, decreased ROM, increased muscle spasms,  postural dysfunction, and pain.   ACTIVITY LIMITATIONS: carrying, lifting, bending, sitting, standing, and locomotion level  PARTICIPATION LIMITATIONS: meal prep, cleaning, laundry, shopping, community activity, and yard work  PERSONAL FACTORS: Time since onset of injury/illness/exacerbation and 1 comorbidity: prior lumbar surgery are also affecting patient's functional outcome.   REHAB POTENTIAL: Fair plus/good-  CLINICAL DECISION MAKING: Evolving/moderate complexity  EVALUATION COMPLEXITY: Low   GOALS:  SHORT TERM GOALS: Target date: 03/05/24.  Ind with an HEP. Goal status: MET   LONG TERM GOALS: Target date: 04/02/24  Stand 20 minutes with pain not > 4/10.  10/9: 15 mins; 10/30:  Goal status: IN PROGRESS  2.  Walk in upright posture with normal gait cycle with a straight cane.  10/9: ambulating without AD; 10/30: continues to ambulate without AD Goal status: IN PROGRESS  3.  Perform ADL's with pain not  4/10.   10/9: 5/10 Goal status: IN PROGRESS  4.  Improve ODI score by at least 10 points. 10/9:  17/50 Goal status: MET   PLAN:  PT FREQUENCY: 2x/week  PT DURATION: 6 weeks  PLANNED INTERVENTIONS: 97110-Therapeutic exercises, 97530- Therapeutic activity, 97112- Neuromuscular re-education, 97535- Self Care, [id number]- Manual therapy, [id number]- Electrical stimulation (unattended), 97035- Ultrasound, 79439 (1-2 muscles), 20561 (3+ muscles)- Dry Needling, Patient/Family education, Cryotherapy, and Moist heat.  PLAN FOR NEXT SESSION: Nustep, SKTC, core exercise progression, STW/M and modalities as needed.     Delon DELENA Gosling, PTA 04/03/2024, 2:47 PM   PHYSICAL THERAPY DISCHARGE SUMMARY  Visits from Start of Care: 10.  Current functional level related to goals / functional outcomes: See above.     Remaining deficits: See goal section.   Education / Equipment: HEP.   Patient agrees to discharge. Patient goals were partially met.     Chad Applegate MPT

## 2024-07-07 ENCOUNTER — Ambulatory Visit: Admitting: Physical Therapy
